# Patient Record
Sex: Male | Born: 1955
Health system: Southern US, Community
[De-identification: ages and names within clinical notes are randomized; demographics above are authoritative.]

## PROBLEM LIST (undated history)

## (undated) DIAGNOSIS — N200 Calculus of kidney: Secondary | ICD-10-CM

## (undated) DIAGNOSIS — I2510 Atherosclerotic heart disease of native coronary artery without angina pectoris: Secondary | ICD-10-CM

## (undated) DIAGNOSIS — C44229 Squamous cell carcinoma of skin of left ear and external auricular canal: Secondary | ICD-10-CM

## (undated) DIAGNOSIS — M1711 Unilateral primary osteoarthritis, right knee: Secondary | ICD-10-CM

## (undated) DIAGNOSIS — N529 Male erectile dysfunction, unspecified: Secondary | ICD-10-CM

## (undated) DIAGNOSIS — E785 Hyperlipidemia, unspecified: Secondary | ICD-10-CM

## (undated) HISTORY — DX: Atherosclerotic heart disease of native coronary artery without angina pectoris: I25.10

## (undated) HISTORY — DX: Hyperlipidemia, unspecified: E78.5

## (undated) HISTORY — DX: Calculus of kidney: N20.0

## (undated) HISTORY — DX: Male erectile dysfunction, unspecified: N52.9

## (undated) HISTORY — PX: LITHOTRIPSY: SUR834

## (undated) HISTORY — PX: NEPHROSTOMY: SHX1014

## (undated) HISTORY — PX: UMBILICAL HERNIA REPAIR: SHX196

## (undated) HISTORY — PX: INGUINAL HERNIA REPAIR: SUR1180

---
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
## 1980-04-06 HISTORY — PX: GANGLION CYST EXCISION: SHX1691

## 1994-04-06 DIAGNOSIS — N200 Calculus of kidney: Secondary | ICD-10-CM

## 1994-04-06 HISTORY — DX: Calculus of kidney: N20.0

## 2005-10-25 ENCOUNTER — Emergency Department (HOSPITAL_COMMUNITY): Admission: EM | Admit: 2005-10-25 | Discharge: 2005-10-26 | Payer: Self-pay | Admitting: Emergency Medicine

## 2005-10-29 ENCOUNTER — Encounter (HOSPITAL_COMMUNITY): Admission: RE | Admit: 2005-10-29 | Discharge: 2005-11-23 | Payer: Self-pay | Admitting: Emergency Medicine

## 2009-03-18 ENCOUNTER — Observation Stay (HOSPITAL_COMMUNITY): Admission: EM | Admit: 2009-03-18 | Discharge: 2009-03-18 | Payer: Self-pay | Admitting: Emergency Medicine

## 2009-04-06 HISTORY — PX: KNEE SURGERY: SHX244

## 2010-07-08 LAB — DIFFERENTIAL
Basophils Absolute: 0 10*3/uL (ref 0.0–0.1)
Basophils Relative: 0 % (ref 0–1)
Eosinophils Relative: 0 % (ref 0–5)
Monocytes Absolute: 0.4 10*3/uL (ref 0.1–1.0)

## 2010-07-08 LAB — CBC
HCT: 50 % (ref 39.0–52.0)
Hemoglobin: 17.2 g/dL — ABNORMAL HIGH (ref 13.0–17.0)
MCHC: 34.5 g/dL (ref 30.0–36.0)
MCV: 89.1 fL (ref 78.0–100.0)
RDW: 12.2 % (ref 11.5–15.5)

## 2010-07-08 LAB — BASIC METABOLIC PANEL
CO2: 22 mEq/L (ref 19–32)
Calcium: 10.5 mg/dL (ref 8.4–10.5)
Glucose, Bld: 173 mg/dL — ABNORMAL HIGH (ref 70–99)
Potassium: 4.3 mEq/L (ref 3.5–5.1)
Sodium: 139 mEq/L (ref 135–145)

## 2010-09-18 ENCOUNTER — Encounter (INDEPENDENT_AMBULATORY_CARE_PROVIDER_SITE_OTHER): Payer: Self-pay | Admitting: Surgery

## 2010-12-12 ENCOUNTER — Encounter (INDEPENDENT_AMBULATORY_CARE_PROVIDER_SITE_OTHER): Payer: Self-pay | Admitting: Surgery

## 2012-05-04 ENCOUNTER — Ambulatory Visit: Payer: Self-pay | Admitting: Family Medicine

## 2012-05-04 VITALS — BP 137/82 | HR 69 | Temp 98.1°F | Resp 16 | Ht 70.5 in | Wt 205.0 lb

## 2012-05-04 DIAGNOSIS — Z0289 Encounter for other administrative examinations: Secondary | ICD-10-CM

## 2012-05-04 DIAGNOSIS — Z0000 Encounter for general adult medical examination without abnormal findings: Secondary | ICD-10-CM

## 2012-05-04 NOTE — Progress Notes (Signed)
[facility] [address], [place] [address] [phone]  Date:  05/04/2012   Name:  Pedro Castaneda   DOB:  1955/04/27   MRN:  [id number]  PCP:  No primary provider on file.    Chief Complaint: Annual Exam   History of Present Illness:  Pedro Castaneda is a 57 y.o. 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  He is generally healthy, and does have a PCP- however, they do not perform DOT exams. He recently had a physical and everything looked ok.  He uses one contact for near vision and one for far vision.  He does have glasses as well but did not bring them with him today  Called Dr. Dione Booze- on 04/24/2011 he was 20/20 in each eye- with his glasses.    There is no problem list on file for this patient.   Past Medical History  Diagnosis Date  . Kidney stones 1996    kidney stones removal    Past Surgical History  Procedure Date  . Ganglion cyst excision 1982    Rt Foot  . Knee surgery 2011  . Elbow surgery 1973    History  Substance Use Topics  . Smoking status: Never Smoker   . Smokeless tobacco: Never Used  . Alcohol Use: No    No family history on file.  No Known Allergies  Medication list has been reviewed and updated.  Current Outpatient Prescriptions on File Prior to Visit  Medication Sig Dispense Refill  . glucosamine-chondroitin 500-400 MG tablet Take 1,200 tablets by mouth 1 dose over 24 hours.        . vitamin C (ASCORBIC ACID) 500 MG tablet Take 500 mg by mouth daily.          Review of Systems:  As per HPI- otherwise negative.   Physical Examination: Filed Vitals:   05/04/12 1429  BP: 137/82  Pulse: 69  Temp: 98.1 F (36.7 C)  Resp: 16   Filed Vitals:   05/04/12 1429  Height: 5' 10.5" (1.791 m)  Weight: 205 lb (92.987 kg)   Body mass index is 29.00 kg/(m^2). Ideal Body Weight: Weight in (lb) to have BMI = 25: 176.4   GEN: WDWN, NAD, Non-toxic, A & O x  3 HEENT: Atraumatic, Normocephalic. Neck supple. No masses, No LAD. Bilateral TM wnl, oropharynx normal.  PEERL,EOMI.   Ears and Nose: No external deformity. CV: RRR, No M/G/R. No JVD. No thrill. No extra heart sounds. PULM: CTA B, no wheezes, crackles, rhonchi. No retractions. No resp. distress. No accessory muscle use. ABD: S, NT, ND. No rebound. No HSM. EXTR: No c/c/e NEURO Normal gait. Normal strength, sensation and DTR all extremities PSYCH: Normally interactive. Conversant. Not depressed or anxious appearing.  Calm demeanor.  GU: no inguinal hernia   Assessment and Plan: 1. Physical exam, annual    Completed [place] DOT form today for a 2 year card- will hold onto it until he returns tomorrow am with his glasses.  Assuming he is at least 20/40 in each eye he can have his DOT card.  Discussed his situation with Frances Furbish, PA- C who will be at clinic early tomorrow morning.    Abbe Amsterdam, MD

## 2012-05-04 NOTE — Patient Instructions (Addendum)
Please come in tomorrow with your glasses- we will recheck your vision and assuming you are 20/40 we will be able to certify you to drive.  Please ask for Yolande Jolly- one of our PAs.

## 2012-05-05 ENCOUNTER — Ambulatory Visit (INDEPENDENT_AMBULATORY_CARE_PROVIDER_SITE_OTHER): Payer: Self-pay

## 2012-05-05 DIAGNOSIS — Z0289 Encounter for other administrative examinations: Secondary | ICD-10-CM

## 2012-05-10 ENCOUNTER — Encounter: Payer: Self-pay | Admitting: Family Medicine

## 2015-03-04 NOTE — Progress Notes (Signed)
Cardiology Office Note   Date:  03/05/2015   ID:  Pedro Spicehomas L Moorhouse, DOB 22-Nov-1955, MRN [id number]   Patient Care Team: Johny BlamerWilliam Harris, MD as PCP - General (Family Medicine) Lyn RecordsHenry W Smith, MD as Consulting Physician (Cardiology)    Chief Complaint  Patient presents with  . New Patient    chest pain, sob     History of Present Illness: Pedro Castaneda is a 59 y.o. male with a hx of HL treated with diet only, nephrolithiasis.  He is referred by his PCP for evaluation of exertional chest pain and shortness of breath.  He has never smoked.  He has remote FHx of CAD (several male cousins have died from MI in their 6150s).  Over the past 2 mos he has started to notice exertional chest pressure and shortness of breath and decreased exercise tolerance.  Symptoms resolve with rest.  He denies any radiating symptoms. Denies nausea but has been diaphoretic.  He denies syncope. He has been dizzy with his CP in the past. He feels like his symptoms have progressed.  He denies any cough or wheezing. there is no relation to meals.  Denies pleuritic chest pain or chest pain with supine.  He denies dysphagia or odynophagia.      Studies/Reports Reviewed Today:  None   Past Medical History  Diagnosis Date  . Kidney stones 1996    kidney stones removal  . HLD (hyperlipidemia)     diet Rx  . ED (erectile dysfunction)     Past Surgical History  Procedure Laterality Date  . Ganglion cyst excision  1982    Rt Foot  . Knee surgery  2011    R knee arthroscopic  . Elbow surgery  1973  . Lithotripsy    . Nephrostomy    . Umbilical hernia repair       Current Outpatient Prescriptions  Medication Sig Dispense Refill  . glucosamine-chondroitin 500-400 MG tablet Take 1,200 tablets by mouth 1 dose over 24 hours.      . vitamin C (ASCORBIC ACID) 500 MG tablet Take 500 mg by mouth daily.      Marland Kitchen. aspirin EC 81 MG tablet Take 1 tablet (81 mg total) by mouth daily. 90 tablet 3  . nitroGLYCERIN  (NITROSTAT) 0.4 MG SL tablet Place 1 tablet (0.4 mg total) under the tongue every 5 (five) minutes as needed for chest pain. 25 tablet 3   No current facility-administered medications for this visit.    Allergies:   Review of patient's allergies indicates no known allergies.    Social History:   Social History   Social History  . Marital Status: Married    Spouse Name: N/A  . Number of Children: N/A  . Years of Education: N/A   Occupational History  . Flooring   . Real Estate    Social History Main Topics  . Smoking status: Never Smoker   . Smokeless tobacco: Never Used  . Alcohol Use: No  . Drug Use: No  . Sexual Activity: Not Asked   Other Topics Concern  . None   Social History Narrative     Family History:   Family History  Problem Relation Age of Onset  . Breast cancer Mother   . Atrial fibrillation Father   . Heart attack Cousin       ROS:   Please see the history of present illness.   Review of Systems  Constitution: Positive for malaise/fatigue.  Cardiovascular: Positive for  chest pain and dyspnea on exertion.  Hematologic/Lymphatic: Bruises/bleeds easily.  All other systems reviewed and are negative.     PHYSICAL EXAM: VS:  BP 108/80 mmHg  Pulse 65  Ht 5' 9.5" (1.765 m)  Wt 211 lb (95.709 kg)  BMI 30.72 kg/m2    Wt Readings from Last 3 Encounters:  03/05/15 211 lb (95.709 kg)  05/04/12 205 lb (92.987 kg)     GEN: Well nourished, well developed, in no acute distress HEENT: normal Neck: no JVD, no carotid bruits, no masses Cardiac:  Normal S1/S2, RRR; no murmur ,  no rubs or gallops, no edema   Respiratory:  clear to auscultation bilaterally, no wheezing, rhonchi or rales. GI: soft, nontender, nondistended, + BS MS: no deformity or atrophy Skin: warm and dry  Neuro:  CNs II-XII intact, Strength and sensation are intact Psych: Normal affect   EKG:  EKG is ordered today.  It demonstrates:   NSR, HR 65, normal axis, QTc 399  ms   Recent Labs: No results found for requested labs within last 365 days.    Lipid Panel No results found for: CHOL, TRIG, HDL, CHOLHDL, VLDL, LDLCALC, LDLDIRECT    ASSESSMENT AND PLAN:  1. Chest Pain:  He has exertional chest pain and dyspnea that seems to be getting worse.  His symptoms seem to suggest angina.  However, he has very few CRFs and his ECG is normal.  He was also seen by Dr. Verdis Prime today.  Etiology of his symptoms are not clear.   -  Arrange Plain ETT to assess for ischemia  -  Arrange Coronary Ca score  -  Start ASA 81 mg QD.    -  NTG prn  2. Hyperlipidemia:   On diet Rx.  Managed by PCP.  If Ca score is high (even if ETT is normal) would consider aggressive lipid control (LDL < 100).       Medication Changes: Current medicines are reviewed at length with the patient today.  Concerns regarding medicines are as outlined above.  The following changes have been made:   Discontinued Medications   No medications on file   Modified Medications   No medications on file   New Prescriptions   ASPIRIN EC 81 MG TABLET    Take 1 tablet (81 mg total) by mouth daily.   NITROGLYCERIN (NITROSTAT) 0.4 MG SL TABLET    Place 1 tablet (0.4 mg total) under the tongue every 5 (five) minutes as needed for chest pain.   Labs/ tests ordered today include:   Orders Placed This Encounter  Procedures  . CT Heart Morp W/Cta Cor W/Score W/Ca W/Cm &/Or Wo/Cm  . Exercise Tolerance Test     Disposition:    FU with Dr. Verdis Prime or me in 1 month.     Signed, Brynda Rim, MHS 03/05/2015 4:32 PM    [facility] [address] [place], [place], [place]  [address] Phone: [phone]; Fax: [phone]

## 2015-03-05 ENCOUNTER — Encounter: Payer: Self-pay | Admitting: Physician Assistant

## 2015-03-05 ENCOUNTER — Other Ambulatory Visit: Payer: Self-pay | Admitting: Family Medicine

## 2015-03-05 ENCOUNTER — Other Ambulatory Visit: Payer: Self-pay | Admitting: *Deleted

## 2015-03-05 ENCOUNTER — Ambulatory Visit (INDEPENDENT_AMBULATORY_CARE_PROVIDER_SITE_OTHER): Payer: BLUE CROSS/BLUE SHIELD | Admitting: Physician Assistant

## 2015-03-05 VITALS — BP 108/80 | HR 65 | Ht 69.5 in | Wt 211.0 lb

## 2015-03-05 DIAGNOSIS — R079 Chest pain, unspecified: Secondary | ICD-10-CM | POA: Diagnosis not present

## 2015-03-05 DIAGNOSIS — R072 Precordial pain: Secondary | ICD-10-CM

## 2015-03-05 DIAGNOSIS — R109 Unspecified abdominal pain: Secondary | ICD-10-CM

## 2015-03-05 DIAGNOSIS — E785 Hyperlipidemia, unspecified: Secondary | ICD-10-CM | POA: Diagnosis not present

## 2015-03-05 MED ORDER — ASPIRIN EC 81 MG PO TBEC: 81.0000 mg | DELAYED_RELEASE_TABLET | Freq: Every day | ORAL | Status: DC

## 2015-03-05 MED ORDER — NITROGLYCERIN 0.4 MG SL SUBL: 0.4000 mg | SUBLINGUAL_TABLET | SUBLINGUAL | Status: DC | PRN

## 2015-03-05 NOTE — Addendum Note (Signed)
Addended by: Burnetta SabinWITTY, Joud Pettinato K on: 03/05/2015 04:38 PM   Modules accepted: Orders

## 2015-03-05 NOTE — Patient Instructions (Addendum)
Medication Instructions:  Your physician has recommended you make the following change in your medication:  1.  START Aspirin 81 mg tablet taking 1 daily 2.  START Nitroglycerin 0.4 mg taking as directed only as needed   Labwork: None ordered  Testing/Procedures: Your physician has requested that you have an exercise tolerance test (PLEASE SCHEDULE WITH SCOTT WEAVER, PA-C)  For further information please visit [email or web address]. Please also follow instruction sheet, as given.  Non-Cardiac CT scanning, WITH CALCIUM SCORING (CAT scanning), is a noninvasive, special x-ray that produces cross-sectional images of the body using x-rays and a computer. CT scans help physicians diagnose and treat medical conditions. For some CT exams, a contrast material is used to enhance visibility in the area of the body being studied. CT scans provide greater clarity and reveal more details than regular x-ray exams.   Follow-Up: Your physician recommends that you schedule a follow-up appointment in: 1 MONTH WITH DR. Katrinka BlazingSMITH OR SCOTT WEAVER, PA-C ON A DAY DR. Katrinka BlazingSMITH IS HERE   Any Other Special Instructions Will Be Listed Below (If Applicable).  Exercise Stress Electrocardiogram An exercise stress electrocardiogram is a test that is done to evaluate the blood supply to your heart. This test may also be called exercise stress electrocardiography. The test is done while you are walking on a treadmill. The goal of this test is to raise your heart rate. This test is done to find areas of poor blood flow to the heart by determining the extent of coronary artery disease (CAD).   CAD is defined as narrowing in one or more heart (coronary) arteries of more than 70%. If you have an abnormal test result, this may mean that you are not getting adequate blood flow to your heart during exercise. Additional testing may be needed to understand why your test was abnormal. LET [facility] PROVIDER KNOW ABOUT:   Any allergies  you have.  All medicines you are taking, including vitamins, herbs, eye drops, creams, and over-the-counter medicines.  Previous problems you or members of your family have had with the use of anesthetics.  Any blood disorders you have.  Previous surgeries you have had.  Medical conditions you have.  Possibility of pregnancy, if this applies. RISKS AND COMPLICATIONS Generally, this is a safe procedure. However, as with any procedure, complications can occur. Possible complications can include:  Pain or pressure in the following areas:  Chest.  Jaw or neck.  Between your shoulder blades.  Radiating down your left arm.  Dizziness or light-headedness.  Shortness of breath.  Increased or irregular heartbeats.  Nausea or vomiting.  Heart attack (rare). BEFORE THE PROCEDURE  Avoid all forms of caffeine 24 hours before your test or as directed by your health care provider. This includes coffee, tea (even decaffeinated tea), caffeinated sodas, chocolate, cocoa, and certain pain medicines.  Follow your health care provider's instructions regarding eating and drinking before the test.  Take your medicines as directed at regular times with water unless instructed otherwise. Exceptions may include:  If you have diabetes, ask how you are to take your insulin or pills. It is common to adjust insulin dosing the morning of the test.  If you are taking beta-blocker medicines, it is important to talk to your health care provider about these medicines well before the date of your test. Taking beta-blocker medicines may interfere with the test. In some cases, these medicines need to be changed or stopped 24 hours or more before the test.  If  you wear a nitroglycerin patch, it may need to be removed prior to the test. Ask your health care provider if the patch should be removed before the test.  If you use an inhaler for any breathing condition, bring it with you to the test.  If you  are an outpatient, bring a snack so you can eat right after the stress phase of the test.  Do not smoke for 4 hours prior to the test or as directed by your health care provider.  Do not apply lotions, powders, creams, or oils on your chest prior to the test.  Wear loose-fitting clothes and comfortable shoes for the test. This test involves walking on a treadmill. PROCEDURE  Multiple patches (electrodes) will be put on your chest. If needed, small areas of your chest may have to be shaved to get better contact with the electrodes. Once the electrodes are attached to your body, multiple wires will be attached to the electrodes and your heart rate will be monitored.  Your heart will be monitored both at rest and while exercising.  You will walk on a treadmill. The treadmill will be started at a slow pace. The treadmill speed and incline will gradually be increased to raise your heart rate. AFTER THE PROCEDURE  Your heart rate and blood pressure will be monitored after the test.  You may return to your normal schedule including diet, activities, and medicines, unless your health care provider tells you otherwise.   This information is not intended to replace advice given to you by your health care provider. Make sure you discuss any questions you have with your health care provider.   Document Released: 03/20/2000 Document Revised: 03/28/2013 Document Reviewed: 11/28/2012 Elsevier Interactive Patient Education 2016 Elsevier Inc.  Coronary Calcium Scan A coronary calcium scan is an imaging test used to look for deposits of calcium and other fatty materials (plaques) in the inner lining of the blood vessels of your heart (coronary arteries). These deposits of calcium and plaques can partly clog and narrow the coronary arteries without producing any symptoms or warning signs. This puts you at risk for a heart attack. This test can detect these deposits before symptoms develop.  LET [facility]  CARE PROVIDER KNOW ABOUT:  Any allergies you have.  All medicines you are taking, including vitamins, herbs, eye drops, creams, and over-the-counter medicines.  Previous problems you or members of your family have had with the use of anesthetics.  Any blood disorders you have.  Previous surgeries you have had.  Medical conditions you have.  Possibility of pregnancy, if this applies. RISKS AND COMPLICATIONS Generally, this is a safe procedure. However, as with any procedure, complications can occur. This test involves the use of radiation. Radiation exposure can be dangerous to a pregnant woman and her unborn baby. If you are pregnant, you should not have this procedure done.  BEFORE THE PROCEDURE There is no special preparation for the procedure. PROCEDURE  You will need to undress and put on a hospital gown. You will need to remove any jewelry around your neck or chest.  Sticky electrodes are placed on your chest and are connected to an electrocardiogram (EKG or electrocardiography) machine to recorda tracing of the electrical activity of your heart.  A CT scanner will take pictures of your heart. During this time, you will be asked to lie still and hold your breath for 2-3 seconds while a picture is being taken of your heart. AFTER THE PROCEDURE  You will be allowed to get dressed.  You can return to your normal activities after the scan is done.   This information is not intended to replace advice given to you by your health care provider. Make sure you discuss any questions you have with your health care provider.   Document Released: 09/19/2007 Document Revised: 03/28/2013 Document Reviewed: 11/28/2012 Elsevier Interactive Patient Education [employer]

## 2015-03-07 ENCOUNTER — Ambulatory Visit
Admission: RE | Admit: 2015-03-07 | Discharge: 2015-03-07 | Disposition: A | Discharge status: Home / Self Care | Payer: BLUE CROSS/BLUE SHIELD | Source: Ambulatory Visit | Attending: Family Medicine | Admitting: Family Medicine

## 2015-03-07 DIAGNOSIS — R109 Unspecified abdominal pain: Secondary | ICD-10-CM

## 2015-03-11 NOTE — Addendum Note (Signed)
Addended by: Reesa ChewJONES, Alwaleed Obeso G on: 03/11/2015 05:46 PM   Modules accepted: Orders

## 2015-04-03 ENCOUNTER — Ambulatory Visit (INDEPENDENT_AMBULATORY_CARE_PROVIDER_SITE_OTHER)
Admission: RE | Admit: 2015-04-03 | Discharge: 2015-04-03 | Disposition: A | Discharge status: Home / Self Care | Payer: Self-pay | Source: Ambulatory Visit | Attending: Physician Assistant | Admitting: Physician Assistant

## 2015-04-03 ENCOUNTER — Encounter: Payer: BLUE CROSS/BLUE SHIELD | Admitting: Physician Assistant

## 2015-04-03 ENCOUNTER — Ambulatory Visit (INDEPENDENT_AMBULATORY_CARE_PROVIDER_SITE_OTHER): Payer: BLUE CROSS/BLUE SHIELD

## 2015-04-03 DIAGNOSIS — E785 Hyperlipidemia, unspecified: Secondary | ICD-10-CM

## 2015-04-03 DIAGNOSIS — R072 Precordial pain: Secondary | ICD-10-CM | POA: Diagnosis not present

## 2015-04-03 DIAGNOSIS — R079 Chest pain, unspecified: Secondary | ICD-10-CM

## 2015-04-03 LAB — EXERCISE TOLERANCE TEST
CHL RATE OF PERCEIVED EXERTION: 17
CSEPEW: 11.7 METS
CSEPHR: 100 %
Exercise duration (min): 10 min
MPHR: 163 {beats}/min
Peak HR: 164 {beats}/min
Rest HR: 77 {beats}/min

## 2015-04-04 ENCOUNTER — Other Ambulatory Visit: Payer: Self-pay | Admitting: *Deleted

## 2015-04-04 DIAGNOSIS — R911 Solitary pulmonary nodule: Secondary | ICD-10-CM

## 2015-04-05 ENCOUNTER — Telehealth: Payer: Self-pay | Admitting: *Deleted

## 2015-04-05 DIAGNOSIS — E785 Hyperlipidemia, unspecified: Secondary | ICD-10-CM

## 2015-04-05 NOTE — Telephone Encounter (Signed)
Lmptcb so that I may advise pt per Dr. Katrinka BlazingSmith who would like FLP/LFT to be done before his next appt. I lmom appt w/Scott W. PA 1/27, so let's try to get lab work 1/23, if need to change date cb to reschedule. 04/11/15 consult w/Dr. Sherene SiresWert.

## 2015-04-09 ENCOUNTER — Encounter: Payer: Self-pay | Admitting: Interventional Cardiology

## 2015-04-09 ENCOUNTER — Telehealth: Payer: Self-pay | Admitting: *Deleted

## 2015-04-09 NOTE — Telephone Encounter (Signed)
I s/w pt today about needing FLP/LFT before appt in our office. Pt states he just had lab work w/PCP. I called PCP and was told yes pt had FLP/LFT 02/2015. I Lmom on med rec dept to fax results to Tereso NewcomerScott Weaver, PA/Dr.Smith.

## 2015-04-10 ENCOUNTER — Telehealth: Payer: Self-pay

## 2015-04-10 DIAGNOSIS — E785 Hyperlipidemia, unspecified: Secondary | ICD-10-CM

## 2015-04-10 NOTE — Telephone Encounter (Signed)
-----   Message from Lyn RecordsHenry W Smith, MD sent at 04/10/2015 11:26 AM EST ----- The lipids are relatively recently performed. The LDL cholesterol is significantly elevated at 141. History should be less than 100 and optimally around 70. Start Crestor 10 mg daily and perform a lipid panel in 4-[redacted] weeks along with liver panel. The previous order for lipid panel should be canceled.

## 2015-04-10 NOTE — Telephone Encounter (Signed)
-----   Message from Henry W Smith, MD sent at 04/10/2015 11:26 AM EST ----- The lipids are relatively recently performed. The LDL cholesterol is significantly elevated at 141. History should be less than 100 and optimally around 70. Start Crestor 10 mg daily and perform a lipid panel in 4-[redacted] weeks along with liver panel. The previous order for lipid panel should be canceled. 

## 2015-04-10 NOTE — Telephone Encounter (Signed)
Pt aware of lab results and Dr.Smith's recommendation. The lipids are relatively recently performed. The LDL cholesterol is significantly elevated at 141. History should be less than 100 and optimally around 70. Start Crestor 10 mg daily and perform a lipid panel in 4-[redacted] weeks along with liver panel. The previous order for lipid panel should be canceled. Pt sts that he has not been vigilant with his and exercise, he has been able to keep his LDL <100 with diet and exercise pt st that he would like to resume his exercise and diet regimen and repeat his fasting lab ins several weeks. He is not opposed to starting statin therapy, but would like to see if he is able to lower his LDL on his own first. Adv pt that I will the message to Dr.Smith and call back with his response.   Pt rqst info on heart healthy diet, and signing up for my chart. Verified pt mailing address. Info mailed to pt

## 2015-04-11 ENCOUNTER — Encounter: Payer: Self-pay | Admitting: Internal Medicine

## 2015-04-11 ENCOUNTER — Ambulatory Visit (INDEPENDENT_AMBULATORY_CARE_PROVIDER_SITE_OTHER): Payer: BLUE CROSS/BLUE SHIELD | Admitting: Internal Medicine

## 2015-04-11 VITALS — BP 146/90 | HR 74 | Ht 69.5 in | Wt 217.0 lb

## 2015-04-11 DIAGNOSIS — R0789 Other chest pain: Secondary | ICD-10-CM | POA: Diagnosis not present

## 2015-04-11 DIAGNOSIS — R911 Solitary pulmonary nodule: Secondary | ICD-10-CM | POA: Diagnosis not present

## 2015-04-11 NOTE — Patient Instructions (Addendum)
Please see patient coordinator before you leave today  to schedule PET scan and I will call you with results  Treatment of chest pain consists of avoiding foods that cause gas (especially beans and raw vegetables like spinach and salads)  and citrucel 1 heaping tsp twice daily with a large glass of water.  Pain should improve w/in 2 weeks and if not then consider further GI work up.    Pepcid 20 mg after bfast and after supper x 6 weeks

## 2015-04-11 NOTE — Progress Notes (Signed)
Subjective:    Patient ID: Pedro Castaneda, male    DOB: Nov 17, 1955,    MRN: [id number]  HPI   [age] yowm quit smoking as teenager developed new CP in summer of 2016 > CT cardiac > 15 mm nodule RUL with no prior cxr's avail > referred to pulmonary by Tereso NewcomerScott Weaver   04/11/2015    04/11/2015 1st [facility] office visit/ Anders Hohmann   Chief Complaint  Patient presents with  . Pulmonary Consult    Referred by Dr Tereso NewcomerScott Weaver for eval of pulmonary nodule. He states he had been having CP for approx 6 months. He still has occ CP- occ with exertion. He has some SOB when he walks up an incline and with doing minimal exertion.   cp comes and goes x 6 months shortest 15 m and longest 3-4 hours, always subst ant chest s radiation/ no n or vomiting / no change with tums  occ wakes up usual hour with cp but doesn't wake him up when asleep and no worse at all with ex or deep breathing  No obvious other patterns in day to day or daytime variabilty or assoc chronic cough or   chest tightness, subjective wheeze overt sinus or hb symptoms. No unusual exp hx or h/o childhood pna/ asthma or knowledge of premature birth.  Sleeping ok without nocturnal  or early am exacerbation  of respiratory  c/o's or need for noct saba. Also denies any obvious fluctuation of symptoms with weather or environmental changes or other aggravating or alleviating factors except as outlined above   Current Medications, Allergies, Complete Past Medical History, Past Surgical History, Family History, and Social History were [facility].             Review of Systems  Constitutional: Negative for fever, chills, activity change, appetite change and unexpected weight change.  HENT: Negative for congestion, dental problem, postnasal drip, rhinorrhea, sneezing, sore throat, trouble swallowing and voice change.   Eyes: Negative for visual disturbance.  Respiratory: Positive for shortness of breath.  Negative for cough and choking.   Cardiovascular: Positive for chest pain. Negative for leg swelling.  Gastrointestinal: Negative for nausea, vomiting and abdominal pain.  Genitourinary: Negative for difficulty urinating.       Acid heartburn  Musculoskeletal: Positive for arthralgias.  Skin: Negative for rash.  Psychiatric/Behavioral: Negative for behavioral problems and confusion.       Objective:   Physical Exam  amb pleasant wm nad  Wt Readings from Last 3 Encounters:  04/11/15 217 lb (98.431 kg)  03/05/15 211 lb (95.709 kg)  05/04/12 205 lb (92.987 kg)    Vital signs reviewed  HEENT: nl dentition, turbinates, and oropharynx. Nl external ear canals without cough reflex   NECK :  without JVD/Nodes/TM/ nl carotid upstrokes bilaterally   LUNGS: no acc muscle use,  Nl contour chest which is clear to A and P bilaterally without cough on insp or exp maneuvers   CV:  RRR  no s3 or murmur or increase in P2, no edema   ABD:  soft and nontender with nl inspiratory excursion in the supine position. No bruits or organomegaly, bowel sounds nl  MS:  Nl gait/ ext warm without deformities, calf tenderness, cyanosis or clubbing No obvious joint restrictions   SKIN: warm and dry without lesions    NEURO:  alert, approp, nl sensorium with  no motor deficits     I personally reviewed images and agree  with radiology impression as follows:  CT Chest   04/03/15 15 mm anterior right upper lobe pulmonary nodule, suspicious for neoplasm.         Assessment & Plan:

## 2015-04-12 DIAGNOSIS — R0789 Other chest pain: Secondary | ICD-10-CM | POA: Insufficient documentation

## 2015-04-12 NOTE — Assessment & Plan Note (Signed)
Classic  pain pattern suggests ibs:  Stereotypical,   very limited distribution of pain locations, daytime > noct, not exacerbated by ex or coughing, worse in sitting position, associated with generalized abd bloating, not present supine due to the dome effect of the diaphragm is  canceled in that position. Frequently these patients have had multiple negative GI workups and CT scans/ cardiac w/u's which is the case here.  Treatment consists of avoiding foods that cause gas (especially beans and raw vegetables like spinach and salads)  and citrucel 1 heaping tsp twice daily with a large glass of water.  Pain should improve w/in 2 weeks and if not then consider further GI work up.     Each maintenance medication was reviewed in detail including most importantly the difference between maintenance and as needed and under what circumstances the prns are to be used.  Please see instructions for details which were reviewed in writing and the patient given a copy.

## 2015-04-12 NOTE — Assessment & Plan Note (Signed)
CT results reviewed with pt > since we don't have any previous chest x-rays available for the last 5 years the best option here is to proceed with a PET scan and then directly to an excisional biopsy if this lesion is positive. If not, given the fact that he only has a minimal smoking history, we could follow him with serial CT scans. However, it should be noted that there is no way that this nodule explains the pattern of chest pain is experienced over the last 6 months and this needs to be sorted out as well. See separate A/P.  Discussed in detail all the  indications, usual  risks and alternatives  relative to the benefits with patient who agrees to proceed with w/u  as outlined    Total time devoted to counseling  = 35/2478m review case including images  with pt/wife/ searching for old cxr's in records here and at Dr Johnathan HausenHarris's office (pt is [facility] [employer] and wanted the old xrays found to clarify before moving forward with PET) /  discussion of options/alternatives/ giving and going over instructions (see avs)

## 2015-04-15 ENCOUNTER — Institutional Professional Consult (permissible substitution): Payer: BLUE CROSS/BLUE SHIELD | Admitting: Internal Medicine

## 2015-04-15 NOTE — Telephone Encounter (Signed)
Called to give pt Dr.Smith's response. Lmom. Dr.Smith.Agrees with this action. Pt should call the office to scheduled a 6-8wk fasting lab appt. Lab orders are in Epic. Pt should tighten up on diet and exercise.

## 2015-04-24 ENCOUNTER — Telehealth: Payer: Self-pay | Admitting: Internal Medicine

## 2015-04-24 ENCOUNTER — Other Ambulatory Visit: Payer: Self-pay | Admitting: Internal Medicine

## 2015-04-24 ENCOUNTER — Ambulatory Visit (HOSPITAL_COMMUNITY)
Admission: RE | Admit: 2015-04-24 | Discharge: 2015-04-24 | Disposition: A | Discharge status: Home / Self Care | Payer: BLUE CROSS/BLUE SHIELD | Source: Ambulatory Visit | Attending: Internal Medicine | Admitting: Internal Medicine

## 2015-04-24 DIAGNOSIS — K4090 Unilateral inguinal hernia, without obstruction or gangrene, not specified as recurrent: Secondary | ICD-10-CM | POA: Diagnosis not present

## 2015-04-24 DIAGNOSIS — R911 Solitary pulmonary nodule: Secondary | ICD-10-CM | POA: Diagnosis present

## 2015-04-24 DIAGNOSIS — Z0189 Encounter for other specified special examinations: Secondary | ICD-10-CM | POA: Diagnosis present

## 2015-04-24 DIAGNOSIS — I700 Atherosclerosis of aorta: Secondary | ICD-10-CM | POA: Diagnosis not present

## 2015-04-24 LAB — GLUCOSE, CAPILLARY: Glucose-Capillary: 103 mg/dL — ABNORMAL HIGH (ref 65–99)

## 2015-04-24 MED ORDER — FLUDEOXYGLUCOSE F - 18 (FDG) INJECTION
10.2900 | Freq: Once | INTRAVENOUS | Status: AC | PRN
  Administered 2015-04-24: 10.29 via INTRAVENOUS

## 2015-04-24 NOTE — Progress Notes (Signed)
Notes Recorded by Nyoka Cowden, MD on 04/24/2015 at 10:00 AM Call patient : Study is not at all suggestive of any kind of the tumor so we just need to do a follow-up CT scan in 3 months to prove it. I placed this in the tickle file.

## 2015-04-24 NOTE — Progress Notes (Signed)
CT order placed

## 2015-04-24 NOTE — Telephone Encounter (Signed)
Result Notes     Notes Recorded by Vito Backers, CMA on 04/24/2015 at 10:16 AM Attempted to contact patient regarding results. Left message on voicemail for patient to return call. ------  Notes Recorded by Nyoka Cowden, MD on 04/24/2015 at 10:00 AM Call patient : Study is not at all suggestive of any kind of the tumor so we just need to do a follow-up CT scan in 3 months to prove it. I placed this in the tickle file.     Left message on pt's voicemail per his request with his results. Nothing further was needed.

## 2015-04-29 ENCOUNTER — Other Ambulatory Visit: Payer: BLUE CROSS/BLUE SHIELD

## 2015-05-02 ENCOUNTER — Telehealth: Payer: Self-pay | Admitting: Physician Assistant

## 2015-05-02 NOTE — Telephone Encounter (Signed)
Pt asked if we could move appt; states not having any symptoms, ok per Bing Neighbors. PA, pt aware if cp resumes call for a sooner appt. Pt agreeable to plan of care. cmf

## 2015-05-02 NOTE — Telephone Encounter (Signed)
New message   Pt has appt on 05/03/15 and he wants to know if he has to come or not?  He dont know why it was scheduled

## 2015-05-02 NOTE — Telephone Encounter (Signed)
I s/w about his appt for 1/27 w/SW PA. Pt states he has not had bee having any problems and is doing ok. Pt advised if doing ok ; then ok per PA to move f/u appt out w/FLP/LFT done the day before appt. Pt advised is sxms resume call for sooner appt.

## 2015-05-03 ENCOUNTER — Ambulatory Visit: Payer: BLUE CROSS/BLUE SHIELD | Admitting: Physician Assistant

## 2015-05-29 ENCOUNTER — Other Ambulatory Visit (INDEPENDENT_AMBULATORY_CARE_PROVIDER_SITE_OTHER): Payer: BLUE CROSS/BLUE SHIELD | Admitting: *Deleted

## 2015-05-29 DIAGNOSIS — E785 Hyperlipidemia, unspecified: Secondary | ICD-10-CM | POA: Diagnosis not present

## 2015-05-29 LAB — LIPID PANEL
CHOL/HDL RATIO: 4.8 ratio (ref <=5.0)
CHOLESTEROL: 176 mg/dL (ref 125–200)
HDL: 37 mg/dL — AB (ref >=40)
LDL Cholesterol: 124 mg/dL (ref <130)
TRIGLYCERIDES: 74 mg/dL (ref <150)
VLDL: 15 mg/dL (ref <30)

## 2015-05-29 LAB — HEPATIC FUNCTION PANEL
ALBUMIN: 3.7 g/dL (ref 3.6–5.1)
ALK PHOS: 76 U/L (ref 40–115)
ALT: 20 U/L (ref 9–46)
AST: 18 U/L (ref 10–35)
Bilirubin, Direct: 0.1 mg/dL (ref <=0.2)
Indirect Bilirubin: 0.4 mg/dL (ref 0.2–1.2)
Total Bilirubin: 0.5 mg/dL (ref 0.2–1.2)
Total Protein: 6.8 g/dL (ref 6.1–8.1)

## 2015-05-29 NOTE — Progress Notes (Signed)
Cardiology Office Note:    Date:  05/30/2015   ID:  Pedro Castaneda, DOB 15-May-1955, MRN [id number]  PCP:  Johny Blamer, MD  Cardiologist:  Dr. Verdis Prime   Electrophysiologist:  n/a  Chief Complaint  Patient presents with  . Chest Pain    Follow up    History of Present Illness:     Pedro Castaneda is a 60 y.o. male with a hx of HL treated with diet only, nephrolithiasis. He was referred by his PCP in November 2016 for evaluation of exertional chest pain and shortness of breath. He has remote FHx of CAD (several male cousins have died from MI in their 4s). He described exertional chest pressure and shortness of breath and decreased exercise tolerance. Chest CT demonstrated a calcium score of 23 which was in the 49th percentile for age and sex matched controls. Exercise treadmill test demonstrated no ischemic ST changes. Statin therapy had been recommended. Extracardiac findings on CT scan did demonstrate 15 mm anterior right upper lobe nodule. He was referred to pulmonology. PET scan was obtained and demonstrated low metabolic activity which favored a benign etiology. Follow-up CT with contrast in 3 months was recommended.   Patient returns for routine follow-up.  He is doing well. He still has occasional chest tightness.  However, this is overall improved. He still plays basketball in an adult league.  He denies chest symptoms during this.  Denies dyspnea, orthopnea, PND, syncope.    Past Medical History  Diagnosis Date  . Kidney stones 1996    kidney stones removal  . HLD (hyperlipidemia)     diet Rx  . Pedro (erectile dysfunction)     Past Surgical History  Procedure Laterality Date  . Ganglion cyst excision  1982    Rt Foot  . Knee surgery  2011    R knee arthroscopic  . Elbow surgery  1973  . Lithotripsy    . Nephrostomy    . Umbilical hernia repair      Current Medications: Outpatient Prescriptions Prior to Visit  Medication Sig Dispense Refill  . ALFALFA PO  1 capsule daily.    Marland Kitchen aspirin EC 81 MG tablet Take 1 tablet (81 mg total) by mouth daily. (Patient taking differently: Take 81 mg by mouth every 6 (six) hours as needed for mild pain. ) 90 tablet 3  . famotidine (PEPCID) 20 MG tablet Take 20 mg by mouth daily as needed for heartburn or indigestion.    Marland Kitchen glucosamine-chondroitin 500-400 MG tablet Take 1,200 tablets by mouth 1 dose over 24 hours.      . Multiple Vitamin (MULTIVITAMIN) capsule Take 1 capsule by mouth daily.    . Red Yeast Rice Extract (RED YEAST RICE PO) Take 1 tablet by mouth daily.    . Tadalafil (CIALIS PO) Take 1 tablet by mouth daily as needed.    . vitamin C (ASCORBIC ACID) 500 MG tablet Take 500 mg by mouth daily.       No facility-administered medications prior to visit.     Allergies:   Review of patient's allergies indicates no known allergies.   Social History   Social History  . Marital Status: Married    Spouse Name: N/A  . Number of Children: N/A  . Years of Education: N/A   Occupational History  . Flooring   . Real Estate    Social History Main Topics  . Smoking status: Former Smoker -- 0.25 packs/day for 1 years  Types: Cigarettes    Quit date: 04/07/1975  . Smokeless tobacco: Never Used  . Alcohol Use: No  . Drug Use: No  . Sexual Activity: Not Asked   Other Topics Concern  . None   Social History Narrative     Family History:  The patient's family history includes Atrial fibrillation in his father; Breast cancer in his mother; Heart attack in his cousin.   ROS:   Please see the history of present illness.    ROS All other systems reviewed and are negative.   Physical Exam:    VS:  BP 122/80 mmHg  Pulse 58  Ht 5' 9.5" (1.765 m)  Wt 203 lb 6.4 oz (92.262 kg)  BMI 29.62 kg/m2  SpO2 98%   GEN: Well nourished, well developed, in no acute distress HEENT: normal Neck: no JVD, no masses Cardiac: Normal S1/S2, RRR; no murmurs, rno edema;  no carotid bruits,   Respiratory:  clear to  auscultation bilaterally; no wheezing, rhonchi or rales GI: soft, nontender  MS: no deformity or atrophy Skin: warm and dry  Neuro:  no focal deficits  Psych: Alert and oriented x 3, normal affect  Wt Readings from Last 3 Encounters:  05/30/15 203 lb 6.4 oz (92.262 kg)  04/11/15 217 lb (98.431 kg)  03/05/15 211 lb (95.709 kg)      Studies/Labs Reviewed:     EKG:  EKG is  ordered today.  The ekg ordered today demonstrates sinus brady, HR 58, QTc 386 ms, no changes  Recent Labs: 05/29/2015: ALT 20   Recent Lipid Panel    Component Value Date/Time   CHOL 176 05/29/2015 0806   TRIG 74 05/29/2015 0806   HDL 37* 05/29/2015 0806   CHOLHDL 4.8 05/29/2015 0806   VLDL 15 05/29/2015 0806   LDLCALC 124 05/29/2015 0806    Additional studies/ records that were reviewed today include:   Cardiac CT 04/03/15 IMPRESSION: Coronary calcium score of 23. This was 49th percentile for age and sex matched control.  ETT 04/03/15 There was no ST segment deviation noted during stress. Good exercise capacity.  He did note chest pain ("tightness").  Normal BP response to exercise. No ST changes to suggest ischemia.   ASSESSMENT:     1. Precordial pain   2. Coronary artery calcification seen on CT scan     PLAN:     In order of problems listed above:  1. Chest Pain: He has a history of exertional chest pain and dyspnea.  CT scan has demonstrated coronary calcification with a calcium score of 23. ETT was low risk and negative for ischemic ST changes. He has not had any progression in his symptoms.  He actually feels better and thinks that his symptoms are related to stress.  2. Coronary calcification on CT -  He should remain on Aspirin.  We had a long discussion regarding statin Rx and its benefits.  He would like to continue with diet and exercise. 10 year CVD risk is 6.9%.  Will FU in 6 mos with repeat Lipid panel.  If LDL still > 70, would recommend starting statin Rx.      Medication Adjustments/Labs and Tests Ordered: Current medicines are reviewed at length with the patient today.  Concerns regarding medicines are outlined above.  Medication changes, Labs and Tests ordered today are outlined in the Patient Instructions noted below. Patient Instructions  Medication Instructions:  Your physician recommends that you continue on your current medications as directed. Please refer  to the Current Medication list given to you today.  Labwork: LIPID 1 WEEK BEFORE YOUR APPT WHEN COME BACK IN 6 MONTHS TO SEE DR. Katrinka Blazing  Testing/Procedures: NONE  Follow-Up: Your physician wants you to follow-up in: 6 MONTHS WITH DR. Katrinka Blazing. You will receive a reminder letter in the mail two months in advance. If you don't receive a letter, please call our office to schedule the follow-up appointment.  Any Other Special Instructions Will Be Listed Below (If Applicable).  If you need a refill on your cardiac medications before your next appointment, please call your pharmacy.   Signed, Tereso Newcomer, PA-C  05/30/2015 9:37 AM    [facility] [address] [place], [place], [place]  [address] Phone: [phone]; Fax: [phone]

## 2015-05-30 ENCOUNTER — Encounter: Payer: Self-pay | Admitting: Physician Assistant

## 2015-05-30 ENCOUNTER — Ambulatory Visit (INDEPENDENT_AMBULATORY_CARE_PROVIDER_SITE_OTHER): Payer: BLUE CROSS/BLUE SHIELD | Admitting: Physician Assistant

## 2015-05-30 VITALS — BP 122/80 | HR 58 | Ht 69.5 in | Wt 203.4 lb

## 2015-05-30 DIAGNOSIS — I2510 Atherosclerotic heart disease of native coronary artery without angina pectoris: Secondary | ICD-10-CM

## 2015-05-30 DIAGNOSIS — R072 Precordial pain: Secondary | ICD-10-CM | POA: Diagnosis not present

## 2015-05-30 DIAGNOSIS — E785 Hyperlipidemia, unspecified: Secondary | ICD-10-CM | POA: Diagnosis not present

## 2015-05-30 NOTE — Patient Instructions (Addendum)
Medication Instructions:  Your physician recommends that you continue on your current medications as directed. Please refer to the Current Medication list given to you today.  Labwork: LIPID 1 WEEK BEFORE YOUR APPT WHEN COME BACK IN 6 MONTHS TO SEE DR. Katrinka Blazing  Testing/Procedures: NONE  Follow-Up: Your physician wants you to follow-up in: 6 MONTHS WITH DR. Katrinka Blazing. You will receive a reminder letter in the mail two months in advance. If you don't receive a letter, please call our office to schedule the follow-up appointment.  Any Other Special Instructions Will Be Listed Below (If Applicable).  If you need a refill on your cardiac medications before your next appointment, please call your pharmacy.

## 2015-07-09 ENCOUNTER — Other Ambulatory Visit: Payer: Self-pay | Admitting: Orthopedic Surgery

## 2015-07-09 DIAGNOSIS — M79604 Pain in right leg: Secondary | ICD-10-CM

## 2015-07-09 DIAGNOSIS — M545 Low back pain: Secondary | ICD-10-CM

## 2015-07-16 ENCOUNTER — Other Ambulatory Visit: Payer: Self-pay | Admitting: Internal Medicine

## 2015-07-16 DIAGNOSIS — R911 Solitary pulmonary nodule: Secondary | ICD-10-CM

## 2015-07-23 ENCOUNTER — Ambulatory Visit (INDEPENDENT_AMBULATORY_CARE_PROVIDER_SITE_OTHER)
Admission: RE | Admit: 2015-07-23 | Discharge: 2015-07-23 | Disposition: A | Discharge status: Home / Self Care | Payer: BLUE CROSS/BLUE SHIELD | Source: Ambulatory Visit | Attending: Internal Medicine | Admitting: Internal Medicine

## 2015-07-23 DIAGNOSIS — R911 Solitary pulmonary nodule: Secondary | ICD-10-CM | POA: Diagnosis not present

## 2015-07-23 NOTE — Progress Notes (Signed)
Quick Note:  Spoke with pt and notified of results per Dr. Wert. Pt verbalized understanding and denied any questions.  ______ 

## 2015-07-28 ENCOUNTER — Ambulatory Visit
Admission: RE | Admit: 2015-07-28 | Discharge: 2015-07-28 | Disposition: A | Discharge status: Home / Self Care | Payer: BLUE CROSS/BLUE SHIELD | Source: Ambulatory Visit | Attending: Orthopedic Surgery | Admitting: Orthopedic Surgery

## 2015-07-28 DIAGNOSIS — M79604 Pain in right leg: Secondary | ICD-10-CM

## 2015-07-28 DIAGNOSIS — M545 Low back pain: Secondary | ICD-10-CM

## 2015-08-13 DIAGNOSIS — M9904 Segmental and somatic dysfunction of sacral region: Secondary | ICD-10-CM | POA: Diagnosis not present

## 2015-08-13 DIAGNOSIS — M25561 Pain in right knee: Secondary | ICD-10-CM | POA: Diagnosis not present

## 2015-08-13 DIAGNOSIS — M545 Low back pain: Secondary | ICD-10-CM | POA: Diagnosis not present

## 2015-08-13 DIAGNOSIS — M9903 Segmental and somatic dysfunction of lumbar region: Secondary | ICD-10-CM | POA: Diagnosis not present

## 2015-10-09 DIAGNOSIS — H02051 Trichiasis without entropian right upper eyelid: Secondary | ICD-10-CM | POA: Diagnosis not present

## 2015-11-04 DIAGNOSIS — K4090 Unilateral inguinal hernia, without obstruction or gangrene, not specified as recurrent: Secondary | ICD-10-CM | POA: Diagnosis not present

## 2015-11-22 DIAGNOSIS — K4090 Unilateral inguinal hernia, without obstruction or gangrene, not specified as recurrent: Secondary | ICD-10-CM | POA: Diagnosis not present

## 2015-11-22 DIAGNOSIS — D176 Benign lipomatous neoplasm of spermatic cord: Secondary | ICD-10-CM | POA: Diagnosis not present

## 2015-12-25 ENCOUNTER — Other Ambulatory Visit: Payer: Self-pay | Admitting: Internal Medicine

## 2015-12-25 DIAGNOSIS — R911 Solitary pulmonary nodule: Secondary | ICD-10-CM

## 2016-01-07 DIAGNOSIS — J028 Acute pharyngitis due to other specified organisms: Secondary | ICD-10-CM | POA: Diagnosis not present

## 2016-01-07 DIAGNOSIS — J0100 Acute maxillary sinusitis, unspecified: Secondary | ICD-10-CM | POA: Diagnosis not present

## 2016-01-23 ENCOUNTER — Ambulatory Visit (INDEPENDENT_AMBULATORY_CARE_PROVIDER_SITE_OTHER)
Admission: RE | Admit: 2016-01-23 | Discharge: 2016-01-23 | Disposition: A | Discharge status: Home / Self Care | Payer: BLUE CROSS/BLUE SHIELD | Source: Ambulatory Visit | Attending: Internal Medicine | Admitting: Internal Medicine

## 2016-01-23 DIAGNOSIS — R911 Solitary pulmonary nodule: Secondary | ICD-10-CM

## 2016-01-24 ENCOUNTER — Telehealth: Payer: Self-pay | Admitting: Internal Medicine

## 2016-01-24 NOTE — Progress Notes (Signed)
lmtcb

## 2016-01-24 NOTE — Telephone Encounter (Signed)
Per 10.19.17 result note: Result Notes  Notes Recorded by Christen ButterLeslie M Raskin, CMA on 01/24/2016 at 10:55 AM EDT lmtcb ------  Notes Recorded by Nyoka CowdenMichael B Wert, MD on 01/23/2016 at 12:38 PM EDT Call pt: Reviewed ct and not change so rec f/u 04/02/17 per radiology guidelines - in reminder file    LMOM TCB x1

## 2016-01-24 NOTE — Progress Notes (Signed)
See phone note dated 01/24/16

## 2016-01-24 NOTE — Telephone Encounter (Signed)
Spoke with pt and notified of results per Dr. Wert. Pt verbalized understanding and denied any questions. 

## 2016-02-24 DIAGNOSIS — R0683 Snoring: Secondary | ICD-10-CM | POA: Diagnosis not present

## 2016-03-24 ENCOUNTER — Emergency Department (HOSPITAL_COMMUNITY): Payer: BLUE CROSS/BLUE SHIELD | Admitting: Anesthesiology

## 2016-03-24 ENCOUNTER — Encounter (HOSPITAL_COMMUNITY): Payer: Self-pay

## 2016-03-24 ENCOUNTER — Other Ambulatory Visit: Payer: Self-pay

## 2016-03-24 ENCOUNTER — Inpatient Hospital Stay (HOSPITAL_COMMUNITY)
Admission: EM | Admit: 2016-03-24 | Discharge: 2016-03-26 | DRG: 343 | Disposition: A | Discharge status: Home / Self Care | Payer: BLUE CROSS/BLUE SHIELD | Attending: Emergency Medicine | Admitting: Emergency Medicine

## 2016-03-24 ENCOUNTER — Encounter (HOSPITAL_COMMUNITY): Admission: EM | Disposition: A | Discharge status: Home / Self Care | Payer: Self-pay | Source: Home / Self Care

## 2016-03-24 ENCOUNTER — Emergency Department (HOSPITAL_COMMUNITY): Payer: BLUE CROSS/BLUE SHIELD

## 2016-03-24 DIAGNOSIS — K219 Gastro-esophageal reflux disease without esophagitis: Secondary | ICD-10-CM | POA: Diagnosis not present

## 2016-03-24 DIAGNOSIS — K3580 Unspecified acute appendicitis: Principal | ICD-10-CM | POA: Diagnosis present

## 2016-03-24 DIAGNOSIS — N400 Enlarged prostate without lower urinary tract symptoms: Secondary | ICD-10-CM | POA: Diagnosis not present

## 2016-03-24 DIAGNOSIS — R911 Solitary pulmonary nodule: Secondary | ICD-10-CM | POA: Diagnosis not present

## 2016-03-24 DIAGNOSIS — E785 Hyperlipidemia, unspecified: Secondary | ICD-10-CM | POA: Diagnosis not present

## 2016-03-24 DIAGNOSIS — Z87891 Personal history of nicotine dependence: Secondary | ICD-10-CM | POA: Diagnosis not present

## 2016-03-24 DIAGNOSIS — R0789 Other chest pain: Secondary | ICD-10-CM | POA: Diagnosis not present

## 2016-03-24 DIAGNOSIS — R109 Unspecified abdominal pain: Secondary | ICD-10-CM | POA: Diagnosis not present

## 2016-03-24 HISTORY — PX: LAPAROSCOPIC APPENDECTOMY: SHX408

## 2016-03-24 LAB — COMPREHENSIVE METABOLIC PANEL
ALBUMIN: 4 g/dL (ref 3.5–5.0)
ALK PHOS: 102 U/L (ref 38–126)
ALT: 27 U/L (ref 17–63)
ANION GAP: 7 (ref 5–15)
AST: 24 U/L (ref 15–41)
BUN: 11 mg/dL (ref 6–20)
CALCIUM: 9.2 mg/dL (ref 8.9–10.3)
CO2: 26 mmol/L (ref 22–32)
CREATININE: 0.99 mg/dL (ref 0.61–1.24)
Chloride: 105 mmol/L (ref 101–111)
GFR calc Af Amer: >60 mL/min (ref >60)
GFR calc non Af Amer: >60 mL/min (ref >60)
GLUCOSE: 119 mg/dL — AB (ref 65–99)
Potassium: 3.9 mmol/L (ref 3.5–5.1)
SODIUM: 138 mmol/L (ref 135–145)
Total Bilirubin: 1.2 mg/dL (ref 0.3–1.2)
Total Protein: 7.2 g/dL (ref 6.5–8.1)

## 2016-03-24 LAB — URINALYSIS, ROUTINE W REFLEX MICROSCOPIC
BILIRUBIN URINE: NEGATIVE
Glucose, UA: NEGATIVE mg/dL
HGB URINE DIPSTICK: NEGATIVE
Ketones, ur: 5 mg/dL — AB
Leukocytes, UA: NEGATIVE
Nitrite: NEGATIVE
PH: 6 (ref 5.0–8.0)
Protein, ur: NEGATIVE mg/dL
SPECIFIC GRAVITY, URINE: 1.018 (ref 1.005–1.030)

## 2016-03-24 LAB — CBC WITH DIFFERENTIAL/PLATELET
Basophils Absolute: 0 10*3/uL (ref 0.0–0.1)
Basophils Relative: 0 %
EOS ABS: 0 10*3/uL (ref 0.0–0.7)
Eosinophils Relative: 0 %
HCT: 44.6 % (ref 39.0–52.0)
HEMOGLOBIN: 15.6 g/dL (ref 13.0–17.0)
LYMPHS ABS: 1 10*3/uL (ref 0.7–4.0)
Lymphocytes Relative: 8 %
MCH: 30.4 pg (ref 26.0–34.0)
MCHC: 35 g/dL (ref 30.0–36.0)
MCV: 86.9 fL (ref 78.0–100.0)
MONOS PCT: 7 %
Monocytes Absolute: 0.9 10*3/uL (ref 0.1–1.0)
NEUTROS PCT: 85 %
Neutro Abs: 11.1 10*3/uL — ABNORMAL HIGH (ref 1.7–7.7)
Platelets: 248 10*3/uL (ref 150–400)
RBC: 5.13 MIL/uL (ref 4.22–5.81)
RDW: 12.7 % (ref 11.5–15.5)
WBC: 13 10*3/uL — ABNORMAL HIGH (ref 4.0–10.5)

## 2016-03-24 LAB — LIPASE, BLOOD: Lipase: 22 U/L (ref 11–51)

## 2016-03-24 SURGERY — APPENDECTOMY, LAPAROSCOPIC
Anesthesia: General

## 2016-03-24 MED ORDER — MORPHINE SULFATE (PF) 2 MG/ML IV SOLN
1.0000 mg | INTRAVENOUS | Status: DC | PRN
  Administered 2016-03-24: 1 mg via INTRAVENOUS
  Filled 2016-03-24: qty 1

## 2016-03-24 MED ORDER — ENOXAPARIN SODIUM 40 MG/0.4ML ~~LOC~~ SOLN: 40.0000 mg | SUBCUTANEOUS | Status: DC

## 2016-03-24 MED ORDER — FENTANYL CITRATE (PF) 100 MCG/2ML IJ SOLN
INTRAMUSCULAR | Status: DC | PRN
  Administered 2016-03-24 (×2): 50 ug via INTRAVENOUS

## 2016-03-24 MED ORDER — IOPAMIDOL (ISOVUE-300) INJECTION 61%
75.0000 mL | Freq: Once | INTRAVENOUS | Status: AC | PRN
  Administered 2016-03-24: 75 mL via INTRAVENOUS

## 2016-03-24 MED ORDER — LACTATED RINGERS IR SOLN
Status: DC | PRN
  Administered 2016-03-24: 3000 mL

## 2016-03-24 MED ORDER — ONDANSETRON HCL 4 MG/2ML IJ SOLN: 4.0000 mg | Freq: Four times a day (QID) | INTRAMUSCULAR | Status: DC | PRN

## 2016-03-24 MED ORDER — LIDOCAINE 2% (20 MG/ML) 5 ML SYRINGE
INTRAMUSCULAR | Status: AC
  Filled 2016-03-24: qty 5

## 2016-03-24 MED ORDER — HYDRALAZINE HCL 20 MG/ML IJ SOLN: 10.0000 mg | INTRAMUSCULAR | Status: DC | PRN

## 2016-03-24 MED ORDER — PIPERACILLIN-TAZOBACTAM 3.375 G IVPB: 3.3750 g | Freq: Three times a day (TID) | INTRAVENOUS | Status: DC

## 2016-03-24 MED ORDER — BUPIVACAINE LIPOSOME 1.3 % IJ SUSP
20.0000 mL | Freq: Once | INTRAMUSCULAR | Status: AC
  Administered 2016-03-24: 20 mL
  Filled 2016-03-24: qty 20

## 2016-03-24 MED ORDER — PROPOFOL 10 MG/ML IV BOLUS
INTRAVENOUS | Status: AC
  Filled 2016-03-24: qty 20

## 2016-03-24 MED ORDER — 0.9 % SODIUM CHLORIDE (POUR BTL) OPTIME
TOPICAL | Status: DC | PRN
  Administered 2016-03-24: 1000 mL

## 2016-03-24 MED ORDER — SUGAMMADEX SODIUM 200 MG/2ML IV SOLN
INTRAVENOUS | Status: DC | PRN
  Administered 2016-03-24: 200 mg via INTRAVENOUS

## 2016-03-24 MED ORDER — ONDANSETRON 4 MG PO TBDP: 4.0000 mg | ORAL_TABLET | Freq: Four times a day (QID) | ORAL | Status: DC | PRN

## 2016-03-24 MED ORDER — HYDROMORPHONE HCL 1 MG/ML IJ SOLN
0.2500 mg | INTRAMUSCULAR | Status: DC | PRN
  Administered 2016-03-24: 0.5 mg via INTRAVENOUS

## 2016-03-24 MED ORDER — SUGAMMADEX SODIUM 200 MG/2ML IV SOLN
INTRAVENOUS | Status: AC
  Filled 2016-03-24: qty 2

## 2016-03-24 MED ORDER — HYDROMORPHONE HCL 1 MG/ML IJ SOLN: 0.2500 mg | INTRAMUSCULAR | Status: DC | PRN

## 2016-03-24 MED ORDER — SUCCINYLCHOLINE CHLORIDE 200 MG/10ML IV SOSY: PREFILLED_SYRINGE | INTRAVENOUS | Status: DC | PRN

## 2016-03-24 MED ORDER — LACTATED RINGERS IV SOLN
INTRAVENOUS | Status: DC | PRN
  Administered 2016-03-24: 18:00:00 via INTRAVENOUS

## 2016-03-24 MED ORDER — SUCCINYLCHOLINE CHLORIDE 200 MG/10ML IV SOSY
PREFILLED_SYRINGE | INTRAVENOUS | Status: AC
  Filled 2016-03-24: qty 10

## 2016-03-24 MED ORDER — KCL IN DEXTROSE-NACL 20-5-0.45 MEQ/L-%-% IV SOLN
INTRAVENOUS | Status: DC
  Administered 2016-03-24: 22:00:00 via INTRAVENOUS
  Filled 2016-03-24 (×2): qty 1000

## 2016-03-24 MED ORDER — IOPAMIDOL (ISOVUE-300) INJECTION 61%: 100.0000 mL | Freq: Once | INTRAVENOUS | Status: DC | PRN

## 2016-03-24 MED ORDER — LIDOCAINE 2% (20 MG/ML) 5 ML SYRINGE
INTRAMUSCULAR | Status: DC | PRN
  Administered 2016-03-24 (×2): 100 mg via INTRAVENOUS

## 2016-03-24 MED ORDER — ONDANSETRON HCL 4 MG/2ML IJ SOLN
INTRAMUSCULAR | Status: AC
  Filled 2016-03-24: qty 2

## 2016-03-24 MED ORDER — HEPARIN SODIUM (PORCINE) 5000 UNIT/ML IJ SOLN
5000.0000 [IU] | Freq: Three times a day (TID) | INTRAMUSCULAR | Status: DC
  Administered 2016-03-24 – 2016-03-26 (×5): 5000 [IU] via SUBCUTANEOUS
  Filled 2016-03-24 (×5): qty 1

## 2016-03-24 MED ORDER — MORPHINE SULFATE (PF) 2 MG/ML IV SOLN
2.0000 mg | INTRAVENOUS | Status: DC | PRN
  Administered 2016-03-24: 4 mg via INTRAVENOUS
  Filled 2016-03-24: qty 2

## 2016-03-24 MED ORDER — PANTOPRAZOLE SODIUM 40 MG IV SOLR: 40.0000 mg | Freq: Every day | INTRAVENOUS | Status: DC

## 2016-03-24 MED ORDER — IOPAMIDOL (ISOVUE-300) INJECTION 61%
INTRAVENOUS | Status: AC
  Filled 2016-03-24: qty 75

## 2016-03-24 MED ORDER — DIPHENHYDRAMINE HCL 25 MG PO CAPS: 25.0000 mg | ORAL_CAPSULE | Freq: Four times a day (QID) | ORAL | Status: DC | PRN

## 2016-03-24 MED ORDER — SUCCINYLCHOLINE CHLORIDE 200 MG/10ML IV SOSY
PREFILLED_SYRINGE | INTRAVENOUS | Status: DC | PRN
  Administered 2016-03-24: 120 mg via INTRAVENOUS

## 2016-03-24 MED ORDER — ROCURONIUM BROMIDE 50 MG/5ML IV SOSY
PREFILLED_SYRINGE | INTRAVENOUS | Status: AC
  Filled 2016-03-24: qty 5

## 2016-03-24 MED ORDER — PROPOFOL 10 MG/ML IV BOLUS
INTRAVENOUS | Status: DC | PRN
  Administered 2016-03-24: 200 mg via INTRAVENOUS

## 2016-03-24 MED ORDER — HYDROMORPHONE HCL 1 MG/ML IJ SOLN
INTRAMUSCULAR | Status: AC
  Filled 2016-03-24: qty 1

## 2016-03-24 MED ORDER — DEXAMETHASONE SODIUM PHOSPHATE 10 MG/ML IJ SOLN
INTRAMUSCULAR | Status: DC | PRN
  Administered 2016-03-24: 10 mg via INTRAVENOUS

## 2016-03-24 MED ORDER — MORPHINE SULFATE (PF) 4 MG/ML IV SOLN
4.0000 mg | Freq: Once | INTRAVENOUS | Status: AC
Start: 2016-03-24 — End: 2016-03-24
  Administered 2016-03-24: 4 mg via INTRAVENOUS
  Filled 2016-03-24: qty 1

## 2016-03-24 MED ORDER — ROCURONIUM BROMIDE 50 MG/5ML IV SOSY
PREFILLED_SYRINGE | INTRAVENOUS | Status: DC | PRN
  Administered 2016-03-24: 30 mg via INTRAVENOUS
  Administered 2016-03-24: 10 mg via INTRAVENOUS

## 2016-03-24 MED ORDER — PIPERACILLIN-TAZOBACTAM 3.375 G IVPB
3.3750 g | Freq: Once | INTRAVENOUS | Status: AC
  Administered 2016-03-24: 3.375 g via INTRAVENOUS
  Filled 2016-03-24: qty 50

## 2016-03-24 MED ORDER — SODIUM CHLORIDE 0.45 % IV SOLN: INTRAVENOUS | Status: DC

## 2016-03-24 MED ORDER — FENTANYL CITRATE (PF) 100 MCG/2ML IJ SOLN
INTRAMUSCULAR | Status: AC
  Filled 2016-03-24: qty 2

## 2016-03-24 MED ORDER — ONDANSETRON HCL 4 MG/2ML IJ SOLN
INTRAMUSCULAR | Status: DC | PRN
  Administered 2016-03-24: 4 mg via INTRAVENOUS

## 2016-03-24 MED ORDER — SODIUM CHLORIDE 0.9 % IJ SOLN
INTRAMUSCULAR | Status: AC
  Filled 2016-03-24: qty 10

## 2016-03-24 MED ORDER — PIPERACILLIN-TAZOBACTAM 3.375 G IVPB
3.3750 g | Freq: Three times a day (TID) | INTRAVENOUS | Status: DC
  Administered 2016-03-24 – 2016-03-26 (×5): 3.375 g via INTRAVENOUS
  Filled 2016-03-24 (×6): qty 50

## 2016-03-24 MED ORDER — DIPHENHYDRAMINE HCL 50 MG/ML IJ SOLN: 25.0000 mg | Freq: Four times a day (QID) | INTRAMUSCULAR | Status: DC | PRN

## 2016-03-24 MED ORDER — DEXAMETHASONE SODIUM PHOSPHATE 10 MG/ML IJ SOLN
INTRAMUSCULAR | Status: AC
  Filled 2016-03-24: qty 1

## 2016-03-24 MED ORDER — SODIUM CHLORIDE 0.9 % IV BOLUS (SEPSIS)
500.0000 mL | Freq: Once | INTRAVENOUS | Status: AC
  Administered 2016-03-24: 500 mL via INTRAVENOUS

## 2016-03-24 SURGICAL SUPPLY — 45 items
ADH SKN CLS APL DERMABOND .7 (GAUZE/BANDAGES/DRESSINGS) ×1
APPLIER CLIP ROT 10 11.4 M/L (STAPLE)
APR CLP MED LRG 11.4X10 (STAPLE)
BAG SPEC RTRVL 10 TROC 200 (ENDOMECHANICALS)
BAG SPEC RTRVL LRG 6X4 10 (ENDOMECHANICALS) ×1
CABLE HIGH FREQUENCY MONO STRZ (ELECTRODE) ×1 IMPLANT
CHLORAPREP W/TINT 26ML (MISCELLANEOUS) ×2 IMPLANT
CLIP APPLIE ROT 10 11.4 M/L (STAPLE) IMPLANT
COVER SURGICAL LIGHT HANDLE (MISCELLANEOUS) ×1 IMPLANT
CUTTER FLEX LINEAR 45M (STAPLE) ×1 IMPLANT
DECANTER SPIKE VIAL GLASS SM (MISCELLANEOUS) ×2 IMPLANT
DERMABOND ADVANCED (GAUZE/BANDAGES/DRESSINGS) ×1
DERMABOND ADVANCED .7 DNX12 (GAUZE/BANDAGES/DRESSINGS) IMPLANT
DRAPE LAPAROSCOPIC ABDOMINAL (DRAPES) ×2 IMPLANT
ELECT REM PT RETURN 9FT ADLT (ELECTROSURGICAL) ×2
ELECTRODE REM PT RTRN 9FT ADLT (ELECTROSURGICAL) ×1 IMPLANT
ENDOLOOP SUT PDS II  0 18 (SUTURE)
ENDOLOOP SUT PDS II 0 18 (SUTURE) IMPLANT
GLOVE BIOGEL M 8.0 STRL (GLOVE) ×2 IMPLANT
GOWN STRL REUS W/TWL XL LVL3 (GOWN DISPOSABLE) ×4 IMPLANT
IRRIG SUCT STRYKERFLOW 2 WTIP (MISCELLANEOUS) ×2
IRRIGATION SUCT STRKRFLW 2 WTP (MISCELLANEOUS) ×1 IMPLANT
KIT BASIN OR (CUSTOM PROCEDURE TRAY) ×2 IMPLANT
POUCH RETRIEVAL ECOSAC 10 (ENDOMECHANICALS) IMPLANT
POUCH RETRIEVAL ECOSAC 10MM (ENDOMECHANICALS)
POUCH SPECIMEN RETRIEVAL 10MM (ENDOMECHANICALS) ×1 IMPLANT
RELOAD 45 VASCULAR/THIN (ENDOMECHANICALS) ×2 IMPLANT
RELOAD STAPLE 45 2.5 WHT GRN (ENDOMECHANICALS) IMPLANT
RELOAD STAPLE 45 3.5 BLU ETS (ENDOMECHANICALS) IMPLANT
RELOAD STAPLE TA45 3.5 REG BLU (ENDOMECHANICALS) IMPLANT
SCISSORS LAP 5X45 EPIX DISP (ENDOMECHANICALS) IMPLANT
SET IRRIG TUBING LAPAROSCOPIC (IRRIGATION / IRRIGATOR) ×1 IMPLANT
SHEARS HARMONIC ACE PLUS 45CM (MISCELLANEOUS) ×2 IMPLANT
SLEEVE XCEL OPT CAN 5 100 (ENDOMECHANICALS) ×2 IMPLANT
STAPLER VISISTAT 35W (STAPLE) ×1 IMPLANT
SUT MNCRL AB 4-0 PS2 18 (SUTURE) ×1 IMPLANT
SUT VIC AB 4-0 SH 18 (SUTURE) ×2 IMPLANT
TOWEL OR 17X26 10 PK STRL BLUE (TOWEL DISPOSABLE) ×2 IMPLANT
TRAY FOLEY W/METER SILVER 14FR (SET/KITS/TRAYS/PACK) ×1 IMPLANT
TRAY LAPAROSCOPIC (CUSTOM PROCEDURE TRAY) ×2 IMPLANT
TROCAR BLADELESS OPT 5 100 (ENDOMECHANICALS) ×2 IMPLANT
TROCAR XCEL 12X100 BLDLESS (ENDOMECHANICALS) ×1 IMPLANT
TROCAR XCEL BLUNT TIP 100MML (ENDOMECHANICALS) ×2 IMPLANT
TROCAR XCEL NON-BLD 11X100MML (ENDOMECHANICALS) IMPLANT
TUBING INSUF HEATED (TUBING) ×2 IMPLANT

## 2016-03-24 NOTE — Interval H&P Note (Signed)
History and Physical Interval Note:  03/24/2016 5:46 PM  Pedro Castaneda  has presented today for surgery, with the diagnosis of acute appendicitis  The various methods of treatment have been discussed with the patient and family. After consideration of risks, benefits and other options for treatment, the patient has consented to  Procedure(s): APPENDECTOMY LAPAROSCOPIC (N/A) as a surgical intervention .  The patient's history has been reviewed, patient examined, no change in status, stable for surgery.  I have reviewed the patient's chart and labs.  Questions were answered to the patient's satisfaction.     Bena Kobel B

## 2016-03-24 NOTE — ED Notes (Addendum)
Pt given cloraprep wipes and instructed to remove clothing. Wife at bedside to help.

## 2016-03-24 NOTE — Anesthesia Preprocedure Evaluation (Addendum)
Anesthesia Evaluation  Patient identified by MRN, date of birth, ID band Patient awake    Reviewed: Allergy & Precautions, NPO status , Patient's Chart, lab work & pertinent test results  Airway Mallampati: II  TM Distance: >3 FB     Dental   Pulmonary former smoker,    breath sounds clear to auscultation       Cardiovascular negative cardio ROS   Rhythm:Regular Rate:Normal  History noted. CG   Neuro/Psych    GI/Hepatic Neg liver ROS, History noted CG   Endo/Other  negative endocrine ROS  Renal/GU Renal disease     Musculoskeletal   Abdominal   Peds  Hematology   Anesthesia Other Findings   Reproductive/Obstetrics                            Anesthesia Physical Anesthesia Plan  ASA: II and emergent  Anesthesia Plan: General   Post-op Pain Management:    Induction: Intravenous, Rapid sequence and Cricoid pressure planned  Airway Management Planned: Oral ETT  Additional Equipment:   Intra-op Plan:   Post-operative Plan: Extubation in OR  Informed Consent: I have reviewed the patients History and Physical, chart, labs and discussed the procedure including the risks, benefits and alternatives for the proposed anesthesia with the patient or authorized representative who has indicated his/her understanding and acceptance.   Dental advisory given  Plan Discussed with: CRNA and Anesthesiologist  Anesthesia Plan Comments:         Anesthesia Quick Evaluation

## 2016-03-24 NOTE — H&P (Signed)
[facility] Admission Note  Pedro Castaneda 19-Feb-1956  [id number].    Requesting MD: Long Chief Complaint/Reason for Consult: Acute appendicitis  HPI:  Pedro Castaneda is a 60yo male who presented to [facility] earlier today with acute onset of abdominal pain yesterday afternoon. States that his pain began in the epigastric region and gradually got worse. Concerned that it was cardiac in nature he took 629m ASA. His pain progressively became more diffuse. Denies nausea, vomiting, fevers, chills, constipation, diarrhea, or dysuria. He tried taking pepto bismal but this also did not help. States that he did not sleep very well therefore saw his PCP today who sent him to the ED due to severity of pain. Last meal was yesterday  ED workup thus far: - CT significant for enlargement and thickening of the appendix wall and stranding of surrounding fat, appendix measures at least 1.4 cm in diameter, no definite perforation - WBC 13, afebrile  PMH significant for HLD, GERD Abdominal surgical history includes umbilical hernia repair ~~5035by Dr. BRush Farmer and right inguinal hernia repair 2017 by Dr. MHassell DoneDenies consistent home use of anticoagulants, but he did take 6513mASA last night Nonsmoker Employment: owns flooring company  ROS: Review of Systems  Constitutional: Negative.   Respiratory: Negative.   Cardiovascular: Negative.   Gastrointestinal: Positive for abdominal pain. Negative for blood in stool, constipation, diarrhea, melena, nausea and vomiting.  Genitourinary: Negative.   Skin: Negative.    All systems reviewed and otherwise negative except for as above  Family History  Problem Relation Age of Onset  . Breast cancer Mother   . Atrial fibrillation Father   . Heart attack Cousin     Past Medical History:  Diagnosis Date  . ED (erectile dysfunction)   . HLD (hyperlipidemia)    diet Rx  . Kidney stones 1996   kidney stones removal    Past Surgical History:   Procedure Laterality Date  . [place]. GANGLION CYST EXCISION  1982   Rt Foot  . INGUINAL HERNIA REPAIR    . KNEE SURGERY  2011   R knee arthroscopic  . LITHOTRIPSY    . NEPHROSTOMY    . UMBILICAL HERNIA REPAIR      Social History:  reports that he quit smoking about 40 years ago. His smoking use included Cigarettes. He has a 0.25 pack-year smoking history. He has never used smokeless tobacco. He reports that he does not drink alcohol or use drugs.  Allergies: No Known Allergies   (Not in a hospital admission)  Blood pressure 182/87, pulse 60, temperature 97.8 F (36.6 C), temperature source Oral, resp. rate 16, height 5' 9.5" (1.765 m), weight 210 lb (95.3 kg), SpO2 100 %. Physical Exam: General: pleasant, WD/WN white male who is laying in bed in NAD HEENT: head is normocephalic, atraumatic.  Sclera are noninjected.  Mouth is dry Heart: regular, rate, and rhythm.  No obvious murmurs, gallops, or rubs noted.  Palpable pedal pulses bilaterally Lungs: CTAB, no wheezes, rhonchi, or rales noted.  Respiratory effort nonlabored Abd: well healed umbilical incision, soft, ND, +BS, no masses, hernias, or organomegaly. Diffuse TTP most severe RLQ MS: all 4 extremities are symmetrical with no cyanosis, clubbing, or edema. Skin: warm and dry with no masses, lesions, or rashes Psych: A&Ox3 with an appropriate affect. Neuro: CM 2-12 intact, extremity CSM intact bilaterally, normal speech  Results for orders placed or performed during the hospital encounter of 03/24/16 (from the past  48 hour(s))  Urinalysis, Routine w reflex microscopic     Status: Abnormal   Collection Time: 03/24/16 12:30 PM  Result Value Ref Range   Color, Urine YELLOW YELLOW   APPearance CLEAR CLEAR   Specific Gravity, Urine 1.018 1.005 - 1.030   pH 6.0 5.0 - 8.0   Glucose, UA NEGATIVE NEGATIVE mg/dL   Hgb urine dipstick NEGATIVE NEGATIVE   Bilirubin Urine NEGATIVE NEGATIVE   Ketones, ur 5 (A) NEGATIVE  mg/dL   Protein, ur NEGATIVE NEGATIVE mg/dL   Nitrite NEGATIVE NEGATIVE   Leukocytes, UA NEGATIVE NEGATIVE  Comprehensive metabolic panel     Status: Abnormal   Collection Time: 03/24/16 12:42 PM  Result Value Ref Range   Sodium 138 135 - 145 mmol/L   Potassium 3.9 3.5 - 5.1 mmol/L   Chloride 105 101 - 111 mmol/L   CO2 26 22 - 32 mmol/L   Glucose, Bld 119 (H) 65 - 99 mg/dL   BUN 11 6 - 20 mg/dL   Creatinine, Ser 0.99 0.61 - 1.24 mg/dL   Calcium 9.2 8.9 - 10.3 mg/dL   Total Protein 7.2 6.5 - 8.1 g/dL   Albumin 4.0 3.5 - 5.0 g/dL   AST 24 15 - 41 U/L   ALT 27 17 - 63 U/L   Alkaline Phosphatase 102 38 - 126 U/L   Total Bilirubin 1.2 0.3 - 1.2 mg/dL   GFR calc non Af Amer >60 >60 mL/min   GFR calc Af Amer >60 >60 mL/min    Comment: (NOTE) The eGFR has been calculated using the CKD EPI equation. This calculation has not been validated in all clinical situations. eGFR's persistently <60 mL/min signify possible Chronic Kidney Disease.    Anion gap 7 5 - 15  Lipase, blood     Status: None   Collection Time: 03/24/16 12:42 PM  Result Value Ref Range   Lipase 22 11 - 51 U/L  CBC with Differential     Status: Abnormal   Collection Time: 03/24/16 12:42 PM  Result Value Ref Range   WBC 13.0 (H) 4.0 - 10.5 K/uL   RBC 5.13 4.22 - 5.81 MIL/uL   Hemoglobin 15.6 13.0 - 17.0 g/dL   HCT 44.6 39.0 - 52.0 %   MCV 86.9 78.0 - 100.0 fL   MCH 30.4 26.0 - 34.0 pg   MCHC 35.0 30.0 - 36.0 g/dL   RDW 12.7 11.5 - 15.5 %   Platelets 248 150 - 400 K/uL   Neutrophils Relative % 85 %   Neutro Abs 11.1 (H) 1.7 - 7.7 K/uL   Lymphocytes Relative 8 %   Lymphs Abs 1.0 0.7 - 4.0 K/uL   Monocytes Relative 7 %   Monocytes Absolute 0.9 0.1 - 1.0 K/uL   Eosinophils Relative 0 %   Eosinophils Absolute 0.0 0.0 - 0.7 K/uL   Basophils Relative 0 %   Basophils Absolute 0.0 0.0 - 0.1 K/uL   Ct Abdomen Pelvis W Contrast  Result Date: 03/24/2016 CLINICAL DATA:  Epigastric pain starting yesterday EXAM: CT  ABDOMEN AND PELVIS WITH CONTRAST TECHNIQUE: Multidetector CT imaging of the abdomen and pelvis was performed using the standard protocol following bolus administration of intravenous contrast. CONTRAST:  31m ISOVUE-300 IOPAMIDOL (ISOVUE-300) INJECTION 61% COMPARISON:  04/24/2015 FINDINGS: Lower chest: Lung bases are normal. Hepatobiliary: Enhanced liver shows indeterminate low-density lesion in right hepatic lobe centrally measures 1.7 cm. Indeterminate lesion in right hepatic lobe just above the gallbladder measures 1.2 cm. Further correlation with nonemergent  enhanced MRI is recommended. Pancreas: Enhanced pancreas is normal. Spleen: Enhanced spleen is normal. Adrenals/Urinary Tract: No adrenal gland mass. Enhanced kidneys are symmetrical in size. There is a left renal cyst measures 9 mm. Delayed renal images shows bilateral renal symmetrical excretion. No hydronephrosis or hydroureter. Bilateral visualized proximal ureter is unremarkable. The urinary bladder shows triangular deformity in right anterior aspect due to pulling effect of a right inguinal scrotal hernia containing fat. No evidence of acute complication. Stomach/Bowel: No small bowel obstruction. There is significant enlargement and thickening of the appendix wall and stranding of surrounding fat. The appendix measures at least 1.4 cm in diameter. Findings are consistent with acute appendicitis. No definite evidence of perforation. Please see axial image 62. Vascular/Lymphatic: Minimal stranding of central mesenteric fat with minimal congested mesenteric vessels centrally axial image 37 stable from prior exam probable chronic mild edema or inflammation. No adenopathy. No aortic aneurysm. Atherosclerotic calcifications of abdominal aorta and iliac arteries are noted Reproductive: No pelvic mass. Prostate gland measures 6.5 by 4.3 cm mild enlarged. Seminal vesicles are unremarkable. Other: No ascites or free abdominal air. Musculoskeletal: No  destructive bony lesions are noted. Sagittal images of the spine shows degenerative changes lumbar spine. Significant disc space flattening with vacuum disc phenomenon at L5-S1 level IMPRESSION: 1. There is acute appendicitis as described above. 2. **An incidental finding of potential clinical significance has been found. Enhanced liver shows indeterminate low-density lesion in right hepatic lobe centrally measures 1.7 cm. Indeterminate lesion in right hepatic lobe just above the gallbladder measures 1.2 cm. Further correlation with nonemergent enhanced MRI is recommended.** 3. There is a a right inguinal scrotal canal hernia containing omental fat measures 3.8 cm without evidence of acute complication. Please note there is pulling effect on the right anterior aspect of the urinary bladder by hernia without definite herniation of the bladder within inguinal canal. 4. Degenerative changes lumbar spine. 5. Mild enlarged prostate gland. Correlation with urology exam is recommended. These results were called by telephone at the time of interpretation on 03/24/2016 at 2:45 pm to Dr. Nanda Quinton , who verbally acknowledged these results. Electronically Signed   By: Lahoma Crocker M.D.   On: 03/24/2016 14:45      Assessment/Plan Acute appendicitis - CT significant for enlargement and thickening of the appendix wall and stranding of surrounding fat, appendix measures at least 1.4 cm in diameter, no definite perforation - WBC 13, afebrile  Incidental findings on CT:  1. Indeterminate low-density lesion in right hepatic lobe centrally measures 1.7 cm, as well as a lesion in right hepatic lobe just above the gallbladder measuring 1.2 cm 2. right inguinal scrotal canal hernia containing omental fat measures 3.8 cm without evidence of acute complication  HLD GERD  Plan - admit to med-surg and plan for OR later today for laparoscopic appendectomy. NPO, IVF, pain control, IV antibiotics (zosyn). Will discuss  incidental CT findings with MD.  Jerrye Beavers, [facility] 03/24/2016, 2:53 PM Pager: [phone] Consults: [phone] Mon-Fri 7:00 am-4:30 pm Sat-Sun 7:00 am-11:30 am  I have seen the patient and agree.  I have discussed lap appendectomy with him.    Kaylyn Lim, MD, FACS

## 2016-03-24 NOTE — ED Notes (Signed)
EKG completed by NT.

## 2016-03-24 NOTE — ED Notes (Signed)
Patient returning from CT

## 2016-03-24 NOTE — ED Triage Notes (Signed)
Patient c/o upper mid abdominal pain that began yesterday. Patient denies N/V/D

## 2016-03-24 NOTE — Op Note (Signed)
Surgeon: Wenda LowMatt Maxen Rowland, MD, FACS  Asst:  none  Anes:  general  Procedure: Laparoscopic appendectomy  Diagnosis: Necrotic appendicitis   Complications: none  EBL:   5 cc  Drains: none  Description of Procedure:  [facility] the evening of Tuesday, 03/24/16.  After anesthesia was administered and the patient was prepped a timeout was performed.  Zosyn had been given in ER.   Prep with Technicare and 5 mm Optiview through the left upper quadrant because of prior mesh umbilical hernia repair Magnus Ivan(Blackman).  Early recurrent RIH Derrell Lolling(Ramirez) noted with fat.  Appendix was dusky purple surrounded with brackish fluid.  Large and twisted, I transected the mesentery to allow visualization of the base.  Vascular load 4.5 mm stapler applied to resect.  No bleeding noted.  Appendix placed in a plastic bag and brought out through the oblique 12 mm port int he left lower quadrant.  R upper quadrant 5 and left upper quadrant five mm trocars.  Port sites injected with Exparel and closed with 4-0 Monocryl.    The patient tolerated the procedure well and was taken to the PACU in stable condition.     Matt B. Daphine DeutscherMartin, MD, [facility], [place] [phone]

## 2016-03-24 NOTE — ED Provider Notes (Signed)
Emergency Department Provider Note   I have reviewed the triage vital signs and the nursing notes.   HISTORY  Chief Complaint Abdominal Pain   HPI Pedro Castaneda is a 60 y.o. male with PMH of HLD and kidney stones presents to the emergency department for evaluation of acute onset epigastric abdominal pain. The patient denies any associated nausea or vomiting. He reports that pain began yesterday evening and has worsened throughout the night. He did not get much sleep. He made an appointment with his primary care doctor who referred him immediately to the emergency department if his level of tenderness. Patient denies any fevers. He continues to have normal bowel movements with no diarrhea. No chest pain or difficulty breathing. No back discomfort. Pain is made worse with palpation. No alleviating factors.   Past Medical History:  Diagnosis Date  . ED (erectile dysfunction)   . HLD (hyperlipidemia)    diet Rx  . Kidney stones 1996   kidney stones removal    Patient Active Problem List   Diagnosis Date Noted  . Chest pain, atypical 04/12/2015  . Solitary pulmonary nodule 04/11/2015    Past Surgical History:  Procedure Laterality Date  . ELBOW SURGERY  1973  . GANGLION CYST EXCISION  1982   Rt Foot  . INGUINAL HERNIA REPAIR    . KNEE SURGERY  2011   R knee arthroscopic  . LITHOTRIPSY    . NEPHROSTOMY    . UMBILICAL HERNIA REPAIR      Current Outpatient Rx  . Order #: [id number] Class: Historical Med  . Order #: [id number] Class: Historical Med  . Order #: [id number] Class: Historical Med  . Order #: [id number] Class: Historical Med  . Order #: [id number] Class: Historical Med  . Order #: [id number] Class: Historical Med  . Order #: [id number] Class: Historical Med  . Order #: [id number] Class: Historical Med  . Order #: [id number] Class: Historical Med  . Order #: [id number] Class: Historical Med  . Order #: [id number] Class: No Print    Allergies Patient has no known  allergies.  Family History  Problem Relation Age of Onset  . Breast cancer Mother   . Atrial fibrillation Father   . Heart attack Cousin     Social History Social History  Substance Use Topics  . Smoking status: Former Smoker    Packs/day: 0.25    Years: 1.00    Types: Cigarettes    Quit date: 04/07/1975  . Smokeless tobacco: Never Used  . Alcohol use No    Review of Systems  Constitutional: No fever/chills Eyes: No visual changes. ENT: No sore throat. Cardiovascular: Denies chest pain. Respiratory: Denies shortness of breath. Gastrointestinal: No abdominal pain.  No nausea, no vomiting.  No diarrhea.  No constipation. Genitourinary: Negative for dysuria. Musculoskeletal: Negative for back pain. Skin: Negative for rash. Neurological: Negative for headaches, focal weakness or numbness.  10-point ROS otherwise negative.  ____________________________________________   PHYSICAL EXAM:  VITAL SIGNS: ED Triage Vitals  Enc Vitals Group     BP 03/24/16 1201 148/98     Pulse Rate 03/24/16 1201 77     Resp 03/24/16 1201 18     Temp 03/24/16 1201 97.8 F (36.6 C)     Temp Source 03/24/16 1201 Oral     SpO2 03/24/16 1201 97 %     Weight 03/24/16 1204 210 lb (95.3 kg)     Height 03/24/16 1204 5' 9.5" (1.765 m)     Pain Score 03/24/16 1204 10   Constitutional: Alert and  oriented. Well appearing and in no acute distress. Eyes: Conjunctivae are normal.  Head: Atraumatic. Nose: No congestion/rhinnorhea. Mouth/Throat: Mucous membranes are moist.  Oropharynx non-erythematous. Neck: No stridor.  Cardiovascular: Normal rate, regular rhythm. Good peripheral circulation. Grossly normal heart sounds.   Respiratory: Normal respiratory effort.  No retractions. Lungs CTAB. Gastrointestinal: Soft with LUQ tenderness and guarding. No rebound. No distention.  Musculoskeletal: No lower extremity tenderness nor edema. No gross deformities of extremities. Neurologic:  Normal speech and  language. No gross focal neurologic deficits are appreciated.  Skin:  Skin is warm, dry and intact. No rash noted. ____________________________________________   LABS (all labs ordered are listed, but only abnormal results are displayed)  Labs Reviewed  COMPREHENSIVE METABOLIC PANEL - Abnormal; Notable for the following:       Result Value   Glucose, Bld 119 (*)    All other components within normal limits  CBC WITH DIFFERENTIAL/PLATELET - Abnormal; Notable for the following:    WBC 13.0 (*)    Neutro Abs 11.1 (*)    All other components within normal limits  URINALYSIS, ROUTINE W REFLEX MICROSCOPIC - Abnormal; Notable for the following:    Ketones, ur 5 (*)    All other components within normal limits  LIPASE, BLOOD   ____________________________________________  RADIOLOGY  Ct Abdomen Pelvis W Contrast  Result Date: 03/24/2016 CLINICAL DATA:  Epigastric pain starting yesterday EXAM: CT ABDOMEN AND PELVIS WITH CONTRAST TECHNIQUE: Multidetector CT imaging of the abdomen and pelvis was performed using the standard protocol following bolus administration of intravenous contrast. CONTRAST:  75mL ISOVUE-300 IOPAMIDOL (ISOVUE-300) INJECTION 61% COMPARISON:  04/24/2015 FINDINGS: Lower chest: Lung bases are normal. Hepatobiliary: Enhanced liver shows indeterminate low-density lesion in right hepatic lobe centrally measures 1.7 cm. Indeterminate lesion in right hepatic lobe just above the gallbladder measures 1.2 cm. Further correlation with nonemergent enhanced MRI is recommended. Pancreas: Enhanced pancreas is normal. Spleen: Enhanced spleen is normal. Adrenals/Urinary Tract: No adrenal gland mass. Enhanced kidneys are symmetrical in size. There is a left renal cyst measures 9 mm. Delayed renal images shows bilateral renal symmetrical excretion. No hydronephrosis or hydroureter. Bilateral visualized proximal ureter is unremarkable. The urinary bladder shows triangular deformity in right anterior  aspect due to pulling effect of a right inguinal scrotal hernia containing fat. No evidence of acute complication. Stomach/Bowel: No small bowel obstruction. There is significant enlargement and thickening of the appendix wall and stranding of surrounding fat. The appendix measures at least 1.4 cm in diameter. Findings are consistent with acute appendicitis. No definite evidence of perforation. Please see axial image 62. Vascular/Lymphatic: Minimal stranding of central mesenteric fat with minimal congested mesenteric vessels centrally axial image 37 stable from prior exam probable chronic mild edema or inflammation. No adenopathy. No aortic aneurysm. Atherosclerotic calcifications of abdominal aorta and iliac arteries are noted Reproductive: No pelvic mass. Prostate gland measures 6.5 by 4.3 cm mild enlarged. Seminal vesicles are unremarkable. Other: No ascites or free abdominal air. Musculoskeletal: No destructive bony lesions are noted. Sagittal images of the spine shows degenerative changes lumbar spine. Significant disc space flattening with vacuum disc phenomenon at L5-S1 level IMPRESSION: 1. There is acute appendicitis as described above. 2. **An incidental finding of potential clinical significance has been found. Enhanced liver shows indeterminate low-density lesion in right hepatic lobe centrally measures 1.7 cm. Indeterminate lesion in right hepatic lobe just above the gallbladder measures 1.2 cm. Further correlation with nonemergent enhanced MRI is recommended.** 3. There is a a right inguinal scrotal canal  hernia containing omental fat measures 3.8 cm without evidence of acute complication. Please note there is pulling effect on the right anterior aspect of the urinary bladder by hernia without definite herniation of the bladder within inguinal canal. 4. Degenerative changes lumbar spine. 5. Mild enlarged prostate gland. Correlation with urology exam is recommended. These results were called by telephone  at the time of interpretation on 03/24/2016 at 2:45 pm to Dr. Alona BeneJOSHUA Shaida Route , who verbally acknowledged these results. Electronically Signed   By: Natasha MeadLiviu  Pop M.D.   On: 03/24/2016 14:45    ____________________________________________   PROCEDURES  Procedure(s) performed:   Procedures  None ____________________________________________   INITIAL IMPRESSION / ASSESSMENT AND PLAN / ED COURSE  Pertinent labs & imaging results that were available during my care of the patient were reviewed by me and considered in my medical decision making (see chart for details).  Patient resents to the emergency room in for evaluation of significant epigastric to left upper quadrant pain. He has significant guarding on exam. No rebound tenderness. No chest pain or difficulty breathing. Low suspicion for anginal equivalent given his level of pain to palpation. He is not tender over his ribs. Plan for baseline labs and CT scan of the abdomen and pelvis along with IVF and pain control.   Spoke with general surgery and the patient to update them regarding the patient's CT scan findings. Last PO was yesterday evening. Will start abx and plan for surgery admission.   ____________________________________________  FINAL CLINICAL IMPRESSION(S) / ED DIAGNOSES  Final diagnoses:  Acute appendicitis, unspecified acute appendicitis type     MEDICATIONS GIVEN DURING THIS VISIT:  Medications  iopamidol (ISOVUE-300) 61 % injection (not administered)  piperacillin-tazobactam (ZOSYN) IVPB 3.375 g (3.375 g Intravenous New Bag/Given 03/24/16 1511)  sodium chloride 0.9 % bolus 500 mL (0 mLs Intravenous Stopped 03/24/16 1330)  morphine 4 MG/ML injection 4 mg (4 mg Intravenous Given 03/24/16 1244)  iopamidol (ISOVUE-300) 61 % injection 75 mL (75 mLs Intravenous Contrast Given 03/24/16 1415)     NEW OUTPATIENT MEDICATIONS STARTED DURING THIS VISIT:  None   Note:  This document was prepared using Dragon voice  recognition software and may include unintentional dictation errors.  Alona BeneJoshua Rick Warnick, MD Emergency Medicine   Maia PlanJoshua G Marvine Encalade, MD 03/24/16 [phone]

## 2016-03-24 NOTE — ED Notes (Signed)
Surgical PA at bedside  

## 2016-03-24 NOTE — Anesthesia Procedure Notes (Signed)
Procedure Name: Intubation Date/Time: 03/24/2016 6:08 PM Performed by: Delphia GratesHANDLER, Theora Vankirk Pre-anesthesia Checklist: Emergency Drugs available, Suction available, Patient being monitored and Patient identified Patient Re-evaluated:Patient Re-evaluated prior to inductionOxygen Delivery Method: Circle system utilized Preoxygenation: Pre-oxygenation with 100% oxygen Intubation Type: IV induction, Cricoid Pressure applied and Rapid sequence Laryngoscope Size: Mac and 4 Grade View: Grade II Tube type: Oral Tube size: 7.5 mm Number of attempts: 1 Airway Equipment and Method: Stylet Placement Confirmation: ETT inserted through vocal cords under direct vision,  positive ETCO2 and breath sounds checked- equal and bilateral Secured at: 22 cm Tube secured with: Tape Dental Injury: Teeth and Oropharynx as per pre-operative assessment

## 2016-03-24 NOTE — Anesthesia Postprocedure Evaluation (Signed)
Anesthesia Post Note  Patient: Curley Spicehomas L Uffelman  Procedure(s) Performed: Procedure(s) (LRB): APPENDECTOMY LAPAROSCOPIC (N/A)  Patient location during evaluation: PACU Anesthesia Type: General Level of consciousness: awake Pain management: pain level controlled Vital Signs Assessment: post-procedure vital signs reviewed and stable Respiratory status: spontaneous breathing Anesthetic complications: no       Last Vitals:  Vitals:   03/24/16 1930 03/24/16 1945  BP: (!) 151/88 (!) 156/101  Pulse: 65 73  Resp: (!) 22 (!) 22  Temp:  36.9 C    Last Pain:  Vitals:   03/24/16 1622  TempSrc:   PainSc: 9                  Brandy Kabat

## 2016-03-24 NOTE — Transfer of Care (Signed)
Immediate Anesthesia Transfer of Care Note  Patient: Pedro Castaneda  Procedure(s) Performed: Procedure(s): APPENDECTOMY LAPAROSCOPIC (N/A)  Patient Location: PACU  Anesthesia Type:General  Level of Consciousness: awake, alert , oriented and patient cooperative  Airway & Oxygen Therapy: Patient Spontanous Breathing and Patient connected to face mask oxygen  Post-op Assessment: Report given to RN and Post -op Vital signs reviewed and stable  Post vital signs: Reviewed and stable  Last Vitals:  Vitals:   03/24/16 1431 03/24/16 1621  BP: 182/87 157/91  Pulse: 60 69  Resp: 16 11  Temp:      Last Pain:  Vitals:   03/24/16 1622  TempSrc:   PainSc: 9          Complications: No apparent anesthesia complications

## 2016-03-24 NOTE — ED Notes (Signed)
Per PCP-sending patient over for epigastric pain, sudden onset-states concern for pancreatitis or chole

## 2016-03-25 ENCOUNTER — Encounter (HOSPITAL_COMMUNITY): Payer: Self-pay | Admitting: Surgery

## 2016-03-25 DIAGNOSIS — K219 Gastro-esophageal reflux disease without esophagitis: Secondary | ICD-10-CM | POA: Diagnosis present

## 2016-03-25 DIAGNOSIS — N400 Enlarged prostate without lower urinary tract symptoms: Secondary | ICD-10-CM | POA: Diagnosis present

## 2016-03-25 DIAGNOSIS — Z87891 Personal history of nicotine dependence: Secondary | ICD-10-CM | POA: Diagnosis not present

## 2016-03-25 DIAGNOSIS — E785 Hyperlipidemia, unspecified: Secondary | ICD-10-CM | POA: Diagnosis present

## 2016-03-25 DIAGNOSIS — K3580 Unspecified acute appendicitis: Secondary | ICD-10-CM | POA: Diagnosis present

## 2016-03-25 LAB — CREATININE, SERUM: CREATININE: 1.16 mg/dL (ref 0.61–1.24)

## 2016-03-25 LAB — CBC
HCT: 42.7 % (ref 39.0–52.0)
HCT: 42 % (ref 39.0–52.0)
HEMOGLOBIN: 14.9 g/dL (ref 13.0–17.0)
Hemoglobin: 14.5 g/dL (ref 13.0–17.0)
MCH: 30.3 pg (ref 26.0–34.0)
MCH: 29.7 pg (ref 26.0–34.0)
MCHC: 34.9 g/dL (ref 30.0–36.0)
MCHC: 34.5 g/dL (ref 30.0–36.0)
MCV: 87 fL (ref 78.0–100.0)
MCV: 86.1 fL (ref 78.0–100.0)
PLATELETS: 242 10*3/uL (ref 150–400)
PLATELETS: 233 10*3/uL (ref 150–400)
RBC: 4.91 MIL/uL (ref 4.22–5.81)
RBC: 4.88 MIL/uL (ref 4.22–5.81)
RDW: 12.8 % (ref 11.5–15.5)
RDW: 12.7 % (ref 11.5–15.5)
WBC: 11.9 10*3/uL — AB (ref 4.0–10.5)
WBC: 14.2 10*3/uL — AB (ref 4.0–10.5)

## 2016-03-25 MED ORDER — ACETAMINOPHEN 325 MG PO TABS: 650.0000 mg | ORAL_TABLET | Freq: Four times a day (QID) | ORAL | Status: DC | PRN

## 2016-03-25 NOTE — Progress Notes (Signed)
Patient ID: Pedro Castaneda, male   DOB: [birth year], 60 y.o.   MRN: [id number]  [facility]  1 Day Post-Op  Subjective: Feeling great, wants to go home. Tolerating regular diet and passing flatus. Has walked several laps around the unit. Has not taken any pain medication since yesterday.  Objective: Vital signs in last 24 hours: Temp:  [97.7 F (36.5 C)-99 F (37.2 C)] 98.9 F (37.2 C) (12/20 0500) Pulse Rate:  [56-107] 100 (12/20 0500) Resp:  [11-22] 20 (12/20 0500) BP: (131-182)/(72-103) 133/74 (12/20 0500) SpO2:  [93 %-100 %] 99 % (12/20 0500) Weight:  [210 lb (95.3 kg)] 210 lb (95.3 kg) (12/19 1204) Last BM Date: 03/24/16  Intake/Output from previous day: 12/19 0701 - 12/20 0700 In: 1600 [I.V.:1000; IV Piggyback:600] Out: 1150 [Urine:1100; Blood:50] Intake/Output this shift: No intake/output data recorded.  PE: Gen:  Alert, NAD, pleasant Card:  RRR, no M/G/R heard Pulm:  CTAB, no W/R/R Abd: Soft, NT/ND, +BS, incisions C/D/I  Lab Results:   Recent Labs  03/24/16 2044 03/25/16 0502  WBC 11.9* 14.2*  HGB 14.9 14.5  HCT 42.7 42.0  PLT 242 233   BMET  Recent Labs  03/24/16 1242 03/24/16 2044  NA 138  --   K 3.9  --   CL 105  --   CO2 26  --   GLUCOSE 119*  --   BUN 11  --   CREATININE 0.99 1.16  CALCIUM 9.2  --    PT/INR No results for input(s): LABPROT, INR in the last 72 hours. CMP     Component Value Date/Time   NA 138 03/24/2016 1242   K 3.9 03/24/2016 1242   CL 105 03/24/2016 1242   CO2 26 03/24/2016 1242   GLUCOSE 119 (H) 03/24/2016 1242   BUN 11 03/24/2016 1242   CREATININE 1.16 03/24/2016 2044   CALCIUM 9.2 03/24/2016 1242   PROT 7.2 03/24/2016 1242   ALBUMIN 4.0 03/24/2016 1242   AST 24 03/24/2016 1242   ALT 27 03/24/2016 1242   ALKPHOS 102 03/24/2016 1242   BILITOT 1.2 03/24/2016 1242   GFRNONAA >60 03/24/2016 2044   GFRAA >60 03/24/2016 2044   Lipase     Component Value Date/Time   LIPASE 22  03/24/2016 1242       Studies/Results: Ct Abdomen Pelvis W Contrast  Result Date: 03/24/2016 CLINICAL DATA:  Epigastric pain starting yesterday EXAM: CT ABDOMEN AND PELVIS WITH CONTRAST TECHNIQUE: Multidetector CT imaging of the abdomen and pelvis was performed using the standard protocol following bolus administration of intravenous contrast. CONTRAST:  75mL ISOVUE-300 IOPAMIDOL (ISOVUE-300) INJECTION 61% COMPARISON:  04/24/2015 FINDINGS: Lower chest: Lung bases are normal. Hepatobiliary: Enhanced liver shows indeterminate low-density lesion in right hepatic lobe centrally measures 1.7 cm. Indeterminate lesion in right hepatic lobe just above the gallbladder measures 1.2 cm. Further correlation with nonemergent enhanced MRI is recommended. Pancreas: Enhanced pancreas is normal. Spleen: Enhanced spleen is normal. Adrenals/Urinary Tract: No adrenal gland mass. Enhanced kidneys are symmetrical in size. There is a left renal cyst measures 9 mm. Delayed renal images shows bilateral renal symmetrical excretion. No hydronephrosis or hydroureter. Bilateral visualized proximal ureter is unremarkable. The urinary bladder shows triangular deformity in right anterior aspect due to pulling effect of a right inguinal scrotal hernia containing fat. No evidence of acute complication. Stomach/Bowel: No small bowel obstruction. There is significant enlargement and thickening of the appendix wall and stranding of surrounding fat. The appendix measures at least 1.4 cm  in diameter. Findings are consistent with acute appendicitis. No definite evidence of perforation. Please see axial image 62. Vascular/Lymphatic: Minimal stranding of central mesenteric fat with minimal congested mesenteric vessels centrally axial image 37 stable from prior exam probable chronic mild edema or inflammation. No adenopathy. No aortic aneurysm. Atherosclerotic calcifications of abdominal aorta and iliac arteries are noted Reproductive: No pelvic  mass. Prostate gland measures 6.5 by 4.3 cm mild enlarged. Seminal vesicles are unremarkable. Other: No ascites or free abdominal air. Musculoskeletal: No destructive bony lesions are noted. Sagittal images of the spine shows degenerative changes lumbar spine. Significant disc space flattening with vacuum disc phenomenon at L5-S1 level IMPRESSION: 1. There is acute appendicitis as described above. 2. **An incidental finding of potential clinical significance has been found. Enhanced liver shows indeterminate low-density lesion in right hepatic lobe centrally measures 1.7 cm. Indeterminate lesion in right hepatic lobe just above the gallbladder measures 1.2 cm. Further correlation with nonemergent enhanced MRI is recommended.** 3. There is a a right inguinal scrotal canal hernia containing omental fat measures 3.8 cm without evidence of acute complication. Please note there is pulling effect on the right anterior aspect of the urinary bladder by hernia without definite herniation of the bladder within inguinal canal. 4. Degenerative changes lumbar spine. 5. Mild enlarged prostate gland. Correlation with urology exam is recommended. These results were called by telephone at the time of interpretation on 03/24/2016 at 2:45 pm to Dr. Alona BeneJOSHUA LONG , who verbally acknowledged these results. Electronically Signed   By: Natasha MeadLiviu  Pop M.D.   On: 03/24/2016 14:45    Anti-infectives: Anti-infectives    Start     Dose/Rate Route Frequency Ordered Stop   03/24/16 2245  piperacillin-tazobactam (ZOSYN) IVPB 3.375 g  Status:  Discontinued     3.375 g 12.5 mL/hr over 240 Minutes Intravenous Every 8 hours 03/24/16 1703 03/24/16 2050   03/24/16 2200  piperacillin-tazobactam (ZOSYN) IVPB 3.375 g     3.375 g 12.5 mL/hr over 240 Minutes Intravenous Every 8 hours 03/24/16 2050     03/24/16 1445  piperacillin-tazobactam (ZOSYN) IVPB 3.375 g     3.375 g 12.5 mL/hr over 240 Minutes Intravenous  Once 03/24/16 1434 03/24/16 1911        Assessment/Plan Necrotic appendicitis  S/p Laparoscopic appendectomy 12/19 Dr. Daphine DeutscherMartin - POD 1 - afebrile  HLD - not on medication GERD - protonix  ID - zosyn 12/19>> FEN - IVF, regular diet VTE - Heparin, SCDs  Plan - progressing well. He is tolerating regular diet and passing flatus. He has ambulated and not taken any pain medication today. Wants to go home but needs some more time on IV zosyn.    LOS: 0 days    Edson SnowballBROOKE A MILLER , [facility] 03/25/2016, 9:00 AM Pager: [phone] Consults: [phone] Mon-Fri 7:00 am-4:30 pm Sat-Sun 7:00 am-11:30 am

## 2016-03-26 LAB — CBC
HCT: 37.8 % — ABNORMAL LOW (ref 39.0–52.0)
Hemoglobin: 13.2 g/dL (ref 13.0–17.0)
MCH: 30.3 pg (ref 26.0–34.0)
MCHC: 34.9 g/dL (ref 30.0–36.0)
MCV: 86.9 fL (ref 78.0–100.0)
PLATELETS: 204 10*3/uL (ref 150–400)
RBC: 4.35 MIL/uL (ref 4.22–5.81)
RDW: 12.9 % (ref 11.5–15.5)
WBC: 10 10*3/uL (ref 4.0–10.5)

## 2016-03-26 MED ORDER — AMOXICILLIN-POT CLAVULANATE 875-125 MG PO TABS: 1.0000 | ORAL_TABLET | Freq: Two times a day (BID) | ORAL | 0 refills | Status: DC

## 2016-03-26 NOTE — Progress Notes (Signed)
Discharge instructions discussed with patient and wife, verbalized agreement and understanding  

## 2016-03-26 NOTE — Discharge Instructions (Signed)

## 2016-03-26 NOTE — Discharge Summary (Signed)
[facility] Discharge Summary   Patient ID: Pedro Castaneda MRN: [id number] DOB/AGE: [birth year] 58 y.o.  Admit date: 03/24/2016 Discharge date: 03/26/2016  Admitting Diagnosis: Acute appendicitis  Discharge Diagnosis Patient Active Problem List   Diagnosis Date Noted  . Acute appendicitis 03/24/2016  . Chest pain, atypical 04/12/2015  . Solitary pulmonary nodule 04/11/2015    Consultants None  Imaging: Ct Abdomen Pelvis W Contrast  Result Date: 03/24/2016 CLINICAL DATA:  Epigastric pain starting yesterday EXAM: CT ABDOMEN AND PELVIS WITH CONTRAST TECHNIQUE: Multidetector CT imaging of the abdomen and pelvis was performed using the standard protocol following bolus administration of intravenous contrast. CONTRAST:  75mL ISOVUE-300 IOPAMIDOL (ISOVUE-300) INJECTION 61% COMPARISON:  04/24/2015 FINDINGS: Lower chest: Lung bases are normal. Hepatobiliary: Enhanced liver shows indeterminate low-density lesion in right hepatic lobe centrally measures 1.7 cm. Indeterminate lesion in right hepatic lobe just above the gallbladder measures 1.2 cm. Further correlation with nonemergent enhanced MRI is recommended. Pancreas: Enhanced pancreas is normal. Spleen: Enhanced spleen is normal. Adrenals/Urinary Tract: No adrenal gland mass. Enhanced kidneys are symmetrical in size. There is a left renal cyst measures 9 mm. Delayed renal images shows bilateral renal symmetrical excretion. No hydronephrosis or hydroureter. Bilateral visualized proximal ureter is unremarkable. The urinary bladder shows triangular deformity in right anterior aspect due to pulling effect of a right inguinal scrotal hernia containing fat. No evidence of acute complication. Stomach/Bowel: No small bowel obstruction. There is significant enlargement and thickening of the appendix wall and stranding of surrounding fat. The appendix measures at least 1.4 cm in diameter. Findings are consistent with acute appendicitis. No  definite evidence of perforation. Please see axial image 62. Vascular/Lymphatic: Minimal stranding of central mesenteric fat with minimal congested mesenteric vessels centrally axial image 37 stable from prior exam probable chronic mild edema or inflammation. No adenopathy. No aortic aneurysm. Atherosclerotic calcifications of abdominal aorta and iliac arteries are noted Reproductive: No pelvic mass. Prostate gland measures 6.5 by 4.3 cm mild enlarged. Seminal vesicles are unremarkable. Other: No ascites or free abdominal air. Musculoskeletal: No destructive bony lesions are noted. Sagittal images of the spine shows degenerative changes lumbar spine. Significant disc space flattening with vacuum disc phenomenon at L5-S1 level IMPRESSION: 1. There is acute appendicitis as described above. 2. **An incidental finding of potential clinical significance has been found. Enhanced liver shows indeterminate low-density lesion in right hepatic lobe centrally measures 1.7 cm. Indeterminate lesion in right hepatic lobe just above the gallbladder measures 1.2 cm. Further correlation with nonemergent enhanced MRI is recommended.** 3. There is a a right inguinal scrotal canal hernia containing omental fat measures 3.8 cm without evidence of acute complication. Please note there is pulling effect on the right anterior aspect of the urinary bladder by hernia without definite herniation of the bladder within inguinal canal. 4. Degenerative changes lumbar spine. 5. Mild enlarged prostate gland. Correlation with urology exam is recommended. These results were called by telephone at the time of interpretation on 03/24/2016 at 2:45 pm to Dr. Alona BeneJOSHUA LONG , who verbally acknowledged these results. Electronically Signed   By: Natasha MeadLiviu  Pop M.D.   On: 03/24/2016 14:45    Procedures Dr. Daphine DeutscherMartin (03/24/16) - Laparoscopic Appendectomy  Hospital Course:  Pedro Castaneda is a 60yo male who [facility] with acute onset abdominal  pain.  Workup showed acute appendicitis.  Patient was admitted and underwent procedure listed above.  Intraoperatively the appendix was found to be necrotic. Tolerated procedure well and was transferred to the  floor.  Diet was advanced as tolerated.  On POD2 the patient was voiding well, tolerating diet, ambulating well, pain well controlled, WBC WNL, vital signs stable, incisions c/d/i and felt stable for discharge home.  Patient was discharged on 5 days of augmentin. He will follow up in our office in 2-3 weeks and knows to call with questions or concerns.     Physical Exam: Gen:  Alert, NAD, pleasant Card:  RRR, no M/G/R heard Pulm:  CTAB, no W/R/R Abd: Soft, NT/ND, +BS, incisions C/D/I  Allergies as of 03/26/2016   No Known Allergies     Medication List    TAKE these medications   ALFALFA PO 1 capsule daily.   amoxicillin-clavulanate 875-125 MG tablet Commonly known as:  AUGMENTIN Take 1 tablet by mouth 2 (two) times daily.   aspirin EC 81 MG tablet Take 1 tablet (81 mg total) by mouth daily. What changed:  Another medication with the same name was removed. Continue taking this medication, and follow the directions you see here.   famotidine 20 MG tablet Commonly known as:  PEPCID Take 20 mg by mouth daily as needed for heartburn or indigestion.   glucosamine-chondroitin 500-400 MG tablet Take 1,200 tablets by mouth 1 dose over 24 hours.   ibuprofen 200 MG tablet Commonly known as:  ADVIL,MOTRIN Take 800 mg by mouth every 6 (six) hours as needed for moderate pain.   multivitamin capsule Take 1 capsule by mouth daily.   RED YEAST RICE PO Take 1 tablet by mouth daily.   tadalafil 5 MG tablet Commonly known as:  CIALIS Take 5 mg by mouth daily as needed for erectile dysfunction.   VITAMIN B-12 PO Take 1 tablet by mouth daily.   vitamin C 500 MG tablet Commonly known as:  ASCORBIC ACID Take 500 mg by mouth daily.        Follow-up Information    [facility] [place] Go on 04/14/2016.   Specialty:  General Surgery Why:  Your appointment is 04/14/2016 at 10:30am. Please arrive 30 minutes prior to your appointment to check in and fill out necessary paperwork. Contact information: [address] [place] [id number] [phone]          Signed: Edson SnowballBROOKE A MILLER, [facility] 03/26/2016, 8:18 AM Pager: [phone] Consults: [phone] Mon-Fri 7:00 am-4:30 pm Sat-Sun 7:00 am-11:30 am

## 2016-04-01 DIAGNOSIS — G4730 Sleep apnea, unspecified: Secondary | ICD-10-CM | POA: Diagnosis not present

## 2016-04-02 DIAGNOSIS — G4733 Obstructive sleep apnea (adult) (pediatric): Secondary | ICD-10-CM | POA: Diagnosis not present

## 2016-04-02 DIAGNOSIS — L821 Other seborrheic keratosis: Secondary | ICD-10-CM | POA: Diagnosis not present

## 2016-04-02 DIAGNOSIS — D1801 Hemangioma of skin and subcutaneous tissue: Secondary | ICD-10-CM | POA: Diagnosis not present

## 2016-04-03 DIAGNOSIS — G4733 Obstructive sleep apnea (adult) (pediatric): Secondary | ICD-10-CM | POA: Diagnosis not present

## 2016-04-24 DIAGNOSIS — S301XXA Contusion of abdominal wall, initial encounter: Secondary | ICD-10-CM | POA: Diagnosis not present

## 2016-07-02 DIAGNOSIS — G4733 Obstructive sleep apnea (adult) (pediatric): Secondary | ICD-10-CM | POA: Diagnosis not present

## 2016-10-02 DIAGNOSIS — G4733 Obstructive sleep apnea (adult) (pediatric): Secondary | ICD-10-CM | POA: Diagnosis not present

## 2016-11-01 DIAGNOSIS — G4733 Obstructive sleep apnea (adult) (pediatric): Secondary | ICD-10-CM | POA: Diagnosis not present

## 2016-12-02 DIAGNOSIS — G4733 Obstructive sleep apnea (adult) (pediatric): Secondary | ICD-10-CM | POA: Diagnosis not present

## 2017-01-02 DIAGNOSIS — G4733 Obstructive sleep apnea (adult) (pediatric): Secondary | ICD-10-CM | POA: Diagnosis not present

## 2017-01-11 DIAGNOSIS — G4733 Obstructive sleep apnea (adult) (pediatric): Secondary | ICD-10-CM | POA: Diagnosis not present

## 2017-01-14 DIAGNOSIS — H01021 Squamous blepharitis right upper eyelid: Secondary | ICD-10-CM | POA: Diagnosis not present

## 2017-01-14 DIAGNOSIS — H01022 Squamous blepharitis right lower eyelid: Secondary | ICD-10-CM | POA: Diagnosis not present

## 2017-01-14 DIAGNOSIS — H2513 Age-related nuclear cataract, bilateral: Secondary | ICD-10-CM | POA: Diagnosis not present

## 2017-01-14 DIAGNOSIS — H01024 Squamous blepharitis left upper eyelid: Secondary | ICD-10-CM | POA: Diagnosis not present

## 2017-02-08 ENCOUNTER — Other Ambulatory Visit: Payer: Self-pay | Admitting: Internal Medicine

## 2017-02-08 DIAGNOSIS — R911 Solitary pulmonary nodule: Secondary | ICD-10-CM

## 2017-04-02 ENCOUNTER — Ambulatory Visit (INDEPENDENT_AMBULATORY_CARE_PROVIDER_SITE_OTHER)
Admission: RE | Admit: 2017-04-02 | Discharge: 2017-04-02 | Disposition: A | Discharge status: Home / Self Care | Payer: BLUE CROSS/BLUE SHIELD | Source: Ambulatory Visit | Attending: Internal Medicine | Admitting: Internal Medicine

## 2017-04-02 DIAGNOSIS — R911 Solitary pulmonary nodule: Secondary | ICD-10-CM

## 2017-07-01 DIAGNOSIS — G4733 Obstructive sleep apnea (adult) (pediatric): Secondary | ICD-10-CM | POA: Diagnosis not present

## 2017-08-20 DIAGNOSIS — Z125 Encounter for screening for malignant neoplasm of prostate: Secondary | ICD-10-CM | POA: Diagnosis not present

## 2017-08-20 DIAGNOSIS — Z0000 Encounter for general adult medical examination without abnormal findings: Secondary | ICD-10-CM | POA: Diagnosis not present

## 2017-08-20 DIAGNOSIS — E7800 Pure hypercholesterolemia, unspecified: Secondary | ICD-10-CM | POA: Diagnosis not present

## 2017-08-20 DIAGNOSIS — Z23 Encounter for immunization: Secondary | ICD-10-CM | POA: Diagnosis not present

## 2017-11-03 ENCOUNTER — Ambulatory Visit (INDEPENDENT_AMBULATORY_CARE_PROVIDER_SITE_OTHER): Payer: Self-pay

## 2017-11-03 ENCOUNTER — Encounter (INDEPENDENT_AMBULATORY_CARE_PROVIDER_SITE_OTHER): Payer: Self-pay | Admitting: Orthopedic Surgery

## 2017-11-03 ENCOUNTER — Ambulatory Visit (INDEPENDENT_AMBULATORY_CARE_PROVIDER_SITE_OTHER): Payer: BLUE CROSS/BLUE SHIELD | Admitting: Orthopedic Surgery

## 2017-11-03 DIAGNOSIS — G8929 Other chronic pain: Secondary | ICD-10-CM

## 2017-11-03 DIAGNOSIS — M1711 Unilateral primary osteoarthritis, right knee: Secondary | ICD-10-CM

## 2017-11-03 DIAGNOSIS — M25561 Pain in right knee: Secondary | ICD-10-CM

## 2017-11-03 NOTE — Progress Notes (Signed)
Office Visit Note   Patient: Pedro Castaneda           Date of Birth: January 18, 1956           MRN: [id number] Visit Date: 11/03/2017 Requested by: Johny Blamer, MD [phone] [place], [place] [address] PCP: Johny Blamer, MD  Subjective: Chief Complaint  Patient presents with  . Right Knee - Pain    HPI: Pedro Castaneda is a patient with right knee pain.  Describes pain primarily on the medial aspect.  Denies any instability.  Reports some weakness and giving way.  He has 7 grandchildren with whom he likes to be very active.  He is doing the bike.  He states the pain is tolerable but he is looking for options for management.              ROS: All systems reviewed are negative as they relate to the chief complaint within the history of present illness.  Patient denies  fevers or chills.   Assessment & Plan: Visit Diagnoses:  1. Chronic pain of right knee   2. Unilateral primary osteoarthritis, right knee     Plan: Impression is right knee medial compartment arthritis with possibility of unicompartmental knee replacement giving him some relief.  We want to hold off on that intervention until absolutely necessary.  He does have enough flexibility on physical exam that he would likely open up enough on that medial side to consider that option.  We would need to check him next clinic visit with a stress view on that femur and knee to see if that medial compartment opens.  In the meantime I like to get an MRI scan to evaluate the lateral and patellofemoral compartments to make sure that there is no excessive wear in these compartments that would increase the risk of him not having a satisfactory result with unicompartmental knee replacement.  I will see him back after his scan.  He is going to avoid inflammatory foods and consider CBD oil as an adjunct to try to make his knee arthritis improved.  Follow-Up Instructions: Return for after MRI.   Orders:  Orders Placed This Encounter   Procedures  . XR KNEE 3 VIEW RIGHT  . MR Knee Right w/o contrast   No orders of the defined types were placed in this encounter.     Procedures: No procedures performed   Clinical Data: No additional findings.  Objective: Vital Signs: There were no vitals taken for this visit.  Physical Exam:   Constitutional: Patient appears well-developed HEENT:  Head: Normocephalic Eyes:EOM are normal Neck: Normal range of motion Cardiovascular: Normal rate Pulmonary/chest: Effort normal Neurologic: Patient is alert Skin: Skin is warm Psychiatric: Patient has normal mood and affect    Ortho Exam: Ortho exam demonstrates slight varus alignment right lower extremity palpable pedal pulses he has about a 5 degree flexion contracture on the right no flexion contracture on the left.  No effusion in the right knee today.  Collateral cruciate ligaments feel stable.  Extensor mechanism is intact.  No other masses lymph adenopathy or skin changes noted in the right knee region  Specialty Comments:  No specialty comments available.  Imaging: Xr Knee 3 View Right  Result Date: 11/03/2017 AP lateral merchant right knee reviewed.  Medial compartment arthritis is present.  Mild to moderate patellofemoral arthritis is noted.  Slight varus alignment present.  Medial joint space more narrowed on the right than the left.  No fracture  dislocation present.    PMFS History: Patient Active Problem List   Diagnosis Date Noted  . Acute appendicitis 03/24/2016  . Chest pain, atypical 04/12/2015  . Solitary pulmonary nodule 04/11/2015   Past Medical History:  Diagnosis Date  . ED (erectile dysfunction)   . HLD (hyperlipidemia)    diet Rx  . Kidney stones 1996   kidney stones removal    Family History  Problem Relation Age of Onset  . Breast cancer Mother   . Atrial fibrillation Father   . Heart attack Cousin     Past Surgical History:  Procedure Laterality Date  . ELBOW SURGERY  1973  .  GANGLION CYST EXCISION  1982   Rt Foot  . INGUINAL HERNIA REPAIR    . KNEE SURGERY  2011   R knee arthroscopic  . LAPAROSCOPIC APPENDECTOMY N/A 03/24/2016   Procedure: APPENDECTOMY LAPAROSCOPIC;  Surgeon: Luretha MurphyMatthew Martin, MD;  Location: [facility];  Service: General;  Laterality: N/A;  . LITHOTRIPSY    . NEPHROSTOMY    . UMBILICAL HERNIA REPAIR     Social History   Occupational History  . Occupation: Chief of Stafflooring  . Occupation: Real Estate  Tobacco Use  . Smoking status: Former Smoker    Packs/day: 0.25    Years: 1.00    Pack years: 0.25    Types: Cigarettes    Last attempt to quit: 04/07/1975    Years since quitting: 42.6  . Smokeless tobacco: Never Used  Substance and Sexual Activity  . Alcohol use: No    Alcohol/week: 0.0 oz  . Drug use: No  . Sexual activity: Not on file

## 2017-11-05 DIAGNOSIS — R1031 Right lower quadrant pain: Secondary | ICD-10-CM | POA: Diagnosis not present

## 2017-11-11 ENCOUNTER — Other Ambulatory Visit: Payer: Self-pay | Admitting: General Surgery

## 2017-11-11 ENCOUNTER — Ambulatory Visit
Admission: RE | Admit: 2017-11-11 | Discharge: 2017-11-11 | Disposition: A | Discharge status: Home / Self Care | Payer: BLUE CROSS/BLUE SHIELD | Source: Ambulatory Visit | Attending: Orthopedic Surgery | Admitting: Orthopedic Surgery

## 2017-11-11 ENCOUNTER — Telehealth (INDEPENDENT_AMBULATORY_CARE_PROVIDER_SITE_OTHER): Payer: Self-pay | Admitting: Orthopedic Surgery

## 2017-11-11 DIAGNOSIS — G8929 Other chronic pain: Secondary | ICD-10-CM

## 2017-11-11 DIAGNOSIS — M25461 Effusion, right knee: Secondary | ICD-10-CM | POA: Diagnosis not present

## 2017-11-11 DIAGNOSIS — M25561 Pain in right knee: Principal | ICD-10-CM

## 2017-11-11 DIAGNOSIS — K4090 Unilateral inguinal hernia, without obstruction or gangrene, not specified as recurrent: Secondary | ICD-10-CM

## 2017-11-11 NOTE — Telephone Encounter (Signed)
Patient called asked if Dr August Saucerean would call him with the results of his MRI? The number to contact patient is [phone]

## 2017-11-11 NOTE — Telephone Encounter (Signed)
Can you please advise on results?

## 2017-11-12 NOTE — Telephone Encounter (Signed)
I tried calling. LMVM for him to call me back to discuss.

## 2017-11-12 NOTE — Telephone Encounter (Signed)
No meniscal pathology, but advanced tricompartmental osteoarthritis.  Best to come in to talk to Pedro Castaneda about this when he gets back in town as it looks like he will need more of a tkr rather than partial like Pedro Castaneda had m entioned in his last note (of course, he needs to make that decision thought, not me)

## 2017-11-12 NOTE — Telephone Encounter (Signed)
FYI See notes below and review scan. Thanks.

## 2017-11-17 ENCOUNTER — Ambulatory Visit
Admission: RE | Admit: 2017-11-17 | Discharge: 2017-11-17 | Disposition: A | Discharge status: Home / Self Care | Payer: BLUE CROSS/BLUE SHIELD | Source: Ambulatory Visit | Attending: General Surgery | Admitting: General Surgery

## 2017-11-17 DIAGNOSIS — K4090 Unilateral inguinal hernia, without obstruction or gangrene, not specified as recurrent: Secondary | ICD-10-CM

## 2017-11-19 NOTE — Telephone Encounter (Signed)
I called.  We are going to get him set up with a DonJoy brace.  He knows when he and I gave the prescription to Toniann FailWendy so she can arrange it thanks

## 2017-11-19 NOTE — Telephone Encounter (Signed)
Do [facility] need to [facility] for [facility] measured for brace?

## 2017-11-21 NOTE — Telephone Encounter (Signed)
I emailed [facility] with Rx and info, and I advised patient that this was done.

## 2017-12-14 DIAGNOSIS — M1711 Unilateral primary osteoarthritis, right knee: Secondary | ICD-10-CM | POA: Diagnosis not present

## 2017-12-15 ENCOUNTER — Other Ambulatory Visit: Payer: Self-pay | Admitting: General Surgery

## 2017-12-15 DIAGNOSIS — K4001 Bilateral inguinal hernia, with obstruction, without gangrene, recurrent: Secondary | ICD-10-CM | POA: Diagnosis not present

## 2017-12-15 DIAGNOSIS — K4031 Unilateral inguinal hernia, with obstruction, without gangrene, recurrent: Secondary | ICD-10-CM | POA: Diagnosis not present

## 2017-12-15 DIAGNOSIS — K4090 Unilateral inguinal hernia, without obstruction or gangrene, not specified as recurrent: Secondary | ICD-10-CM | POA: Diagnosis not present

## 2018-02-07 IMAGING — CT CT ABD-PELV W/ CM
2 of 5 series · 15 of 46 positions shown, 17 images · IV contrast (ISOVUE)
Comparison: 04/24/2015

CLINICAL DATA: Epigastric pain starting yesterday

EXAM:
CT ABDOMEN AND PELVIS WITH CONTRAST
TECHNIQUE: Multidetector CT imaging of the abdomen and pelvis was performed
using the standard protocol following bolus administration of
intravenous contrast.
CONTRAST:  75mL GRD53O-EOO IOPAMIDOL (GRD53O-EOO) INJECTION 61%

[Series 2: abd/pel with · axial · 0.87mm/px · z∈[-512,-62]mm · 12 of 104 slices shown, 14 images]
[im 7/104  soft-tissue]
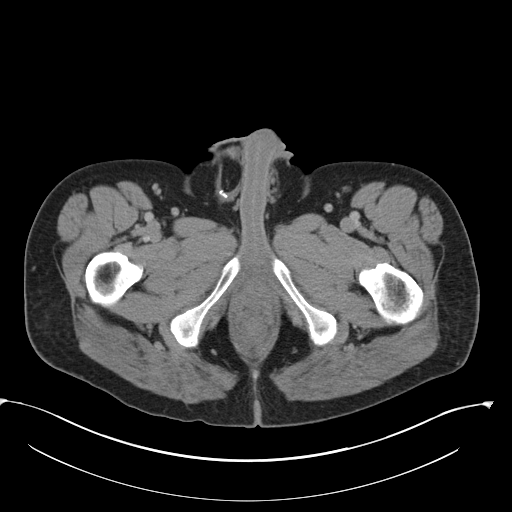
[im 7/104  bone]
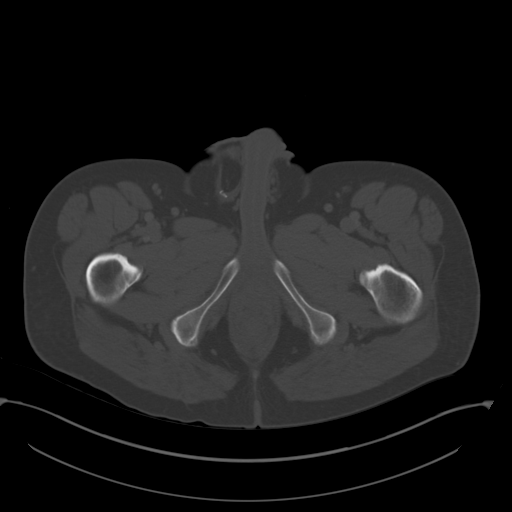
[im 14/104  soft-tissue]
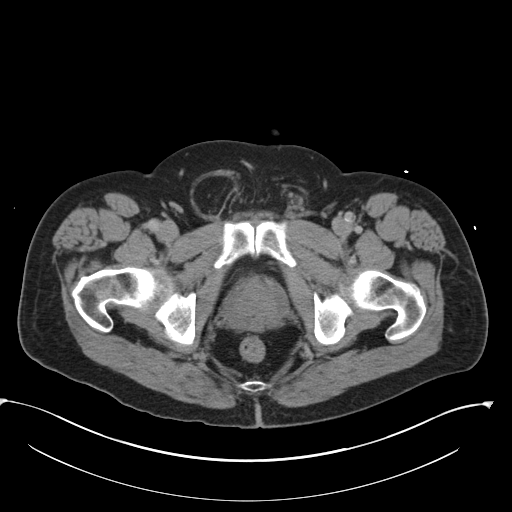
[im 21/104  soft-tissue]
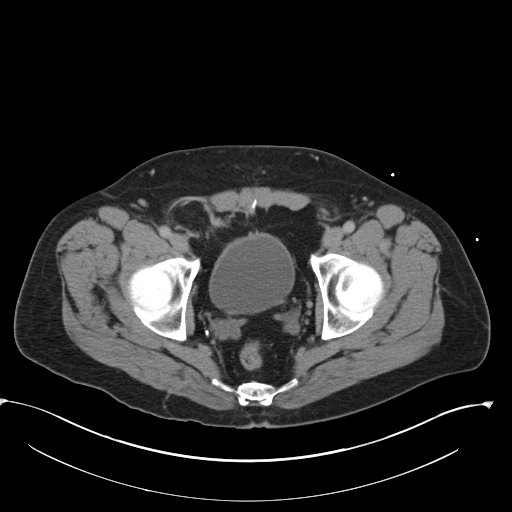
[im 35/104  soft-tissue]
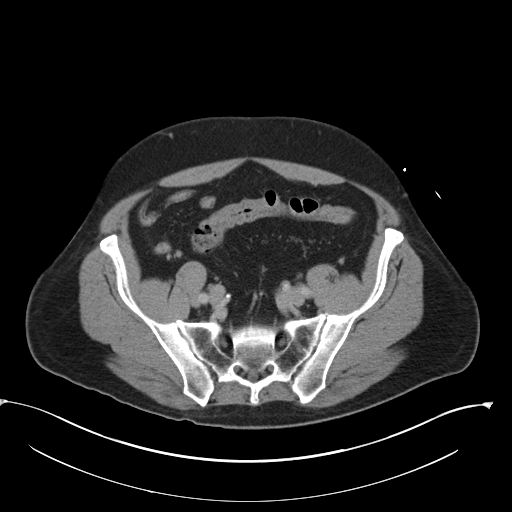
[im 42/104  soft-tissue]
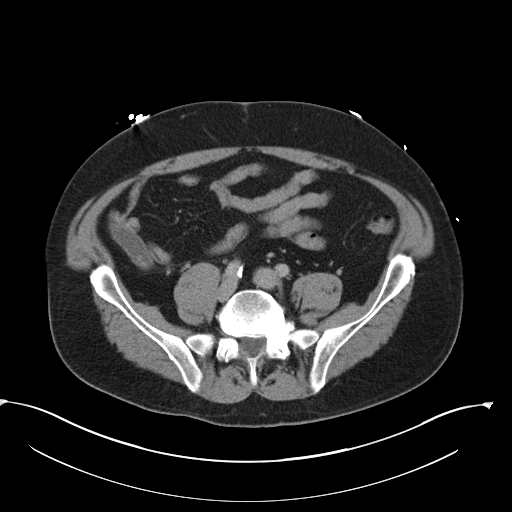
[im 49/104  soft-tissue]
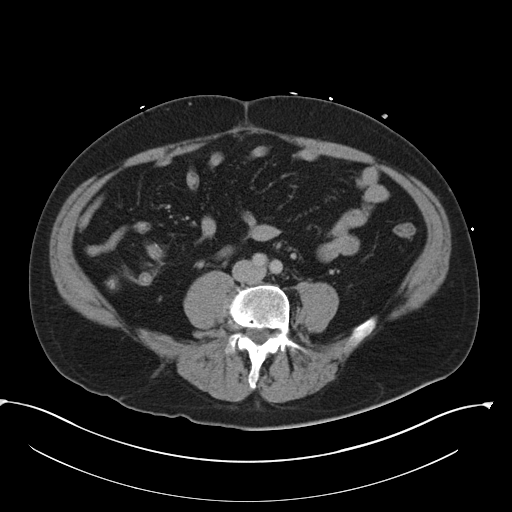
[im 55/104  soft-tissue]
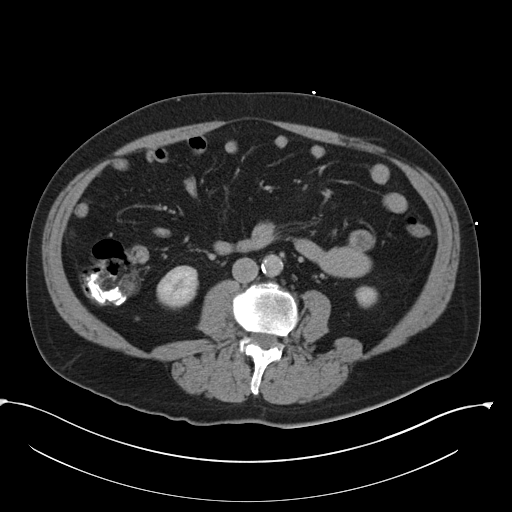
[im 62/104  soft-tissue]
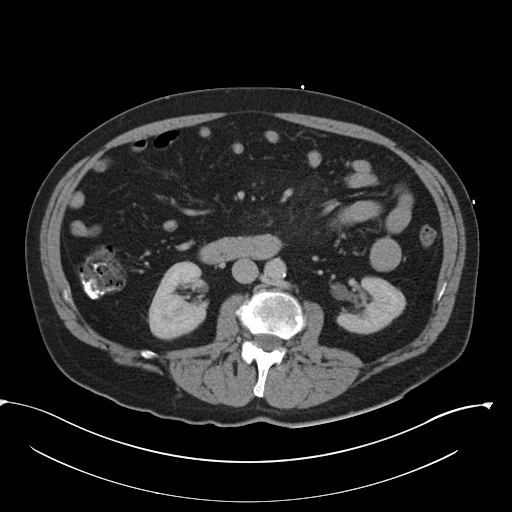
[im 69/104  soft-tissue]
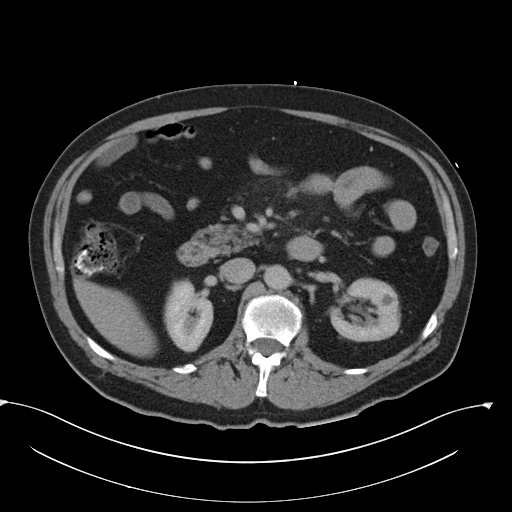
[im 69/104  bone]
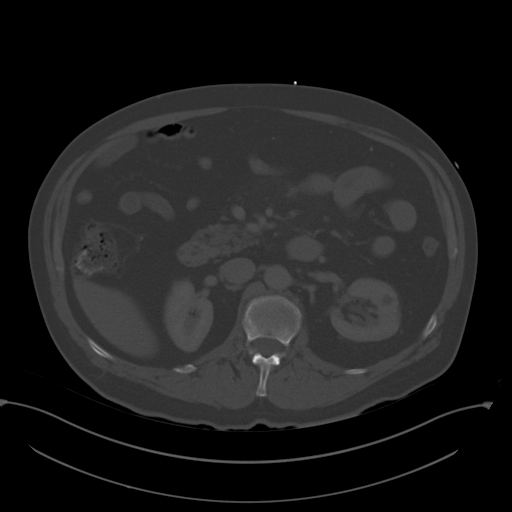
[im 83/104  soft-tissue]
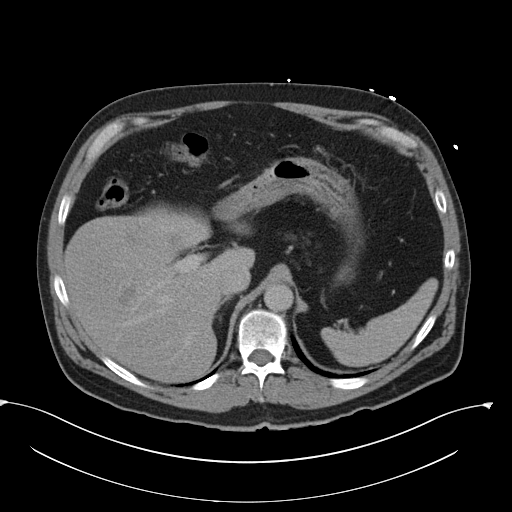
[im 90/104  soft-tissue]
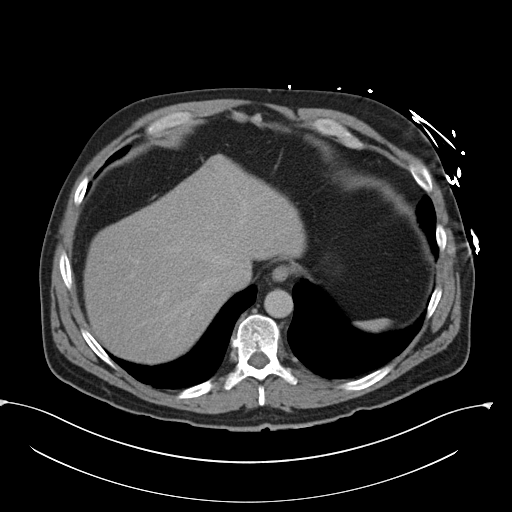
[im 97/104  soft-tissue]
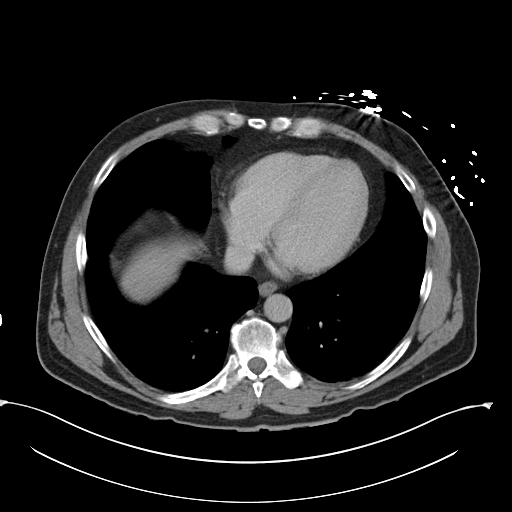

[Series 3: coronal a/|p · coronal · 0.84mm/px · 3 of 148 slices shown]
[im 50/148  soft-tissue]
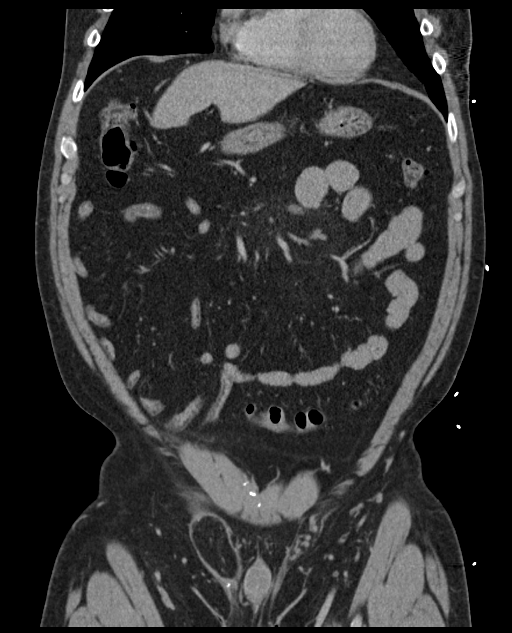
[im 66/148  soft-tissue]
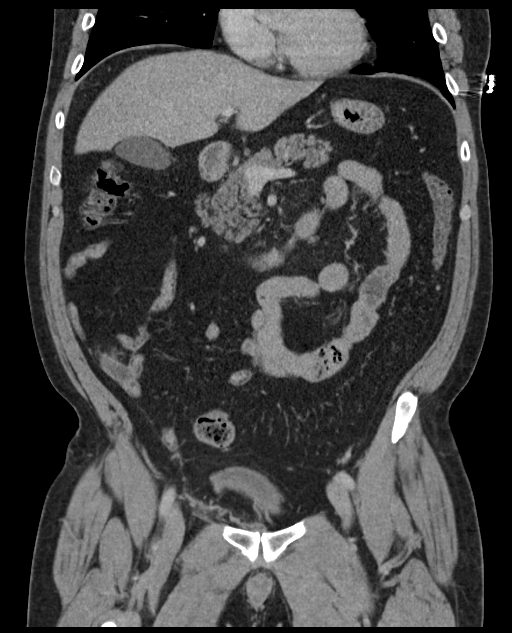
[im 82/148  soft-tissue]
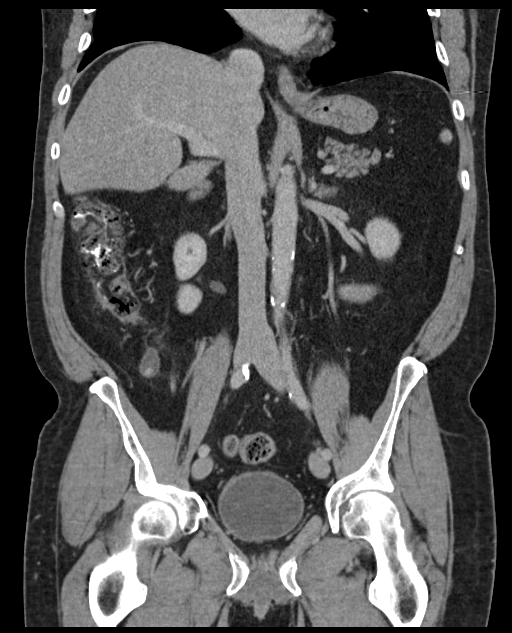

[15 of 46 positions shown; findings below may reference images not displayed]

FINDINGS: Lower chest: Lung bases are normal.

Hepatobiliary: Enhanced liver shows indeterminate low-density lesion
in right hepatic lobe centrally measures 1.7 cm. Indeterminate
lesion in right hepatic lobe just above the gallbladder measures
cm. Further correlation with nonemergent enhanced MRI is
recommended.

Pancreas: Enhanced pancreas is normal.

Spleen: Enhanced spleen is normal.

Adrenals/Urinary Tract: No adrenal gland mass. Enhanced kidneys are
symmetrical in size. There is a left renal cyst measures 9 mm.
Delayed renal images shows bilateral renal symmetrical excretion. No
hydronephrosis or hydroureter. Bilateral visualized proximal ureter
is unremarkable. The urinary bladder shows triangular deformity in
right anterior aspect due to pulling effect of a right inguinal
scrotal hernia containing fat. No evidence of acute complication.

Stomach/Bowel: No small bowel obstruction. There is significant
enlargement and thickening of the appendix wall and stranding of
surrounding fat. The appendix measures at least 1.4 cm in diameter.
Findings are consistent with acute appendicitis. No definite
evidence of perforation. Please see axial image 62.

Vascular/Lymphatic: Minimal stranding of central mesenteric fat with
minimal congested mesenteric vessels centrally axial image 37 stable
from prior exam probable chronic mild edema or inflammation. No
adenopathy. No aortic aneurysm. Atherosclerotic calcifications of
abdominal aorta and iliac arteries are noted

Reproductive: No pelvic mass. Prostate gland measures 6.5 by 4.3 cm
mild enlarged. Seminal vesicles are unremarkable.

Other: No ascites or free abdominal air.

Musculoskeletal: No destructive bony lesions are noted. Sagittal
images of the spine

shows degenerative changes lumbar spine. Significant disc space
flattening with vacuum disc phenomenon at L5-S1 level
IMPRESSION: 1. There is acute appendicitis as described above.
2. **An incidental finding of potential clinical significance has
been found. Enhanced liver shows indeterminate low-density lesion in
right hepatic lobe centrally measures 1.7 cm. Indeterminate lesion
in right hepatic lobe just above the gallbladder measures 1.2 cm.
Further correlation with nonemergent enhanced MRI is recommended.**
3. There is a a right inguinal scrotal canal hernia containing
omental fat measures 3.8 cm without evidence of acute complication.
Please note there is pulling effect on the right anterior aspect of
the urinary bladder by hernia without definite herniation of the
bladder within inguinal canal.
4. Degenerative changes lumbar spine.
5. Mild enlarged prostate gland. Correlation with urology exam is
recommended.

These results were called by telephone at the time of interpretation
on 03/24/2016 at [DATE] to Dr. MOB CARILLO , who verbally
acknowledged these results.

## 2018-04-12 ENCOUNTER — Ambulatory Visit (INDEPENDENT_AMBULATORY_CARE_PROVIDER_SITE_OTHER): Payer: BLUE CROSS/BLUE SHIELD | Admitting: Medical

## 2018-04-12 ENCOUNTER — Encounter: Payer: Self-pay | Admitting: Physician Assistant

## 2018-04-12 ENCOUNTER — Encounter (INDEPENDENT_AMBULATORY_CARE_PROVIDER_SITE_OTHER): Payer: Self-pay

## 2018-04-12 VITALS — BP 142/86 | HR 60 | Ht 70.0 in | Wt 212.0 lb

## 2018-04-12 DIAGNOSIS — R030 Elevated blood-pressure reading, without diagnosis of hypertension: Secondary | ICD-10-CM

## 2018-04-12 DIAGNOSIS — E785 Hyperlipidemia, unspecified: Secondary | ICD-10-CM

## 2018-04-12 DIAGNOSIS — R252 Cramp and spasm: Secondary | ICD-10-CM

## 2018-04-12 DIAGNOSIS — R079 Chest pain, unspecified: Secondary | ICD-10-CM | POA: Diagnosis not present

## 2018-04-12 DIAGNOSIS — G4733 Obstructive sleep apnea (adult) (pediatric): Secondary | ICD-10-CM | POA: Diagnosis not present

## 2018-04-12 LAB — LIPID PANEL
CHOL/HDL RATIO: 4.8 ratio (ref 0.0–5.0)
Cholesterol, Total: 223 mg/dL — ABNORMAL HIGH (ref 100–199)
HDL: 46 mg/dL (ref >39)
LDL Calculated: 163 mg/dL — ABNORMAL HIGH (ref 0–99)
Triglycerides: 70 mg/dL (ref 0–149)
VLDL Cholesterol Cal: 14 mg/dL (ref 5–40)

## 2018-04-12 LAB — BASIC METABOLIC PANEL
BUN/Creatinine Ratio: 21 (ref 10–24)
BUN: 21 mg/dL (ref 8–27)
CO2: 21 mmol/L (ref 20–29)
Calcium: 9.5 mg/dL (ref 8.6–10.2)
Chloride: 101 mmol/L (ref 96–106)
Creatinine, Ser: 1.02 mg/dL (ref 0.76–1.27)
GFR calc Af Amer: 92 mL/min/{1.73_m2} (ref >59)
GFR calc non Af Amer: 80 mL/min/{1.73_m2} (ref >59)
Glucose: 92 mg/dL (ref 65–99)
POTASSIUM: 5 mmol/L (ref 3.5–5.2)
Sodium: 138 mmol/L (ref 134–144)

## 2018-04-12 LAB — MAGNESIUM: Magnesium: 2.3 mg/dL (ref 1.6–2.3)

## 2018-04-12 MED ORDER — METOPROLOL TARTRATE 25 MG PO TABS: 12.5000 mg | ORAL_TABLET | Freq: Once | ORAL | 0 refills | Status: DC

## 2018-04-12 NOTE — Progress Notes (Signed)
Cardiology Office Note   Date:  04/12/2018   ID:  Pedro Castaneda, DOB Jul 10, 1955, MRN [id number]  PCP:  Johny Blamer, MD  Cardiologist:  No primary care provider on file. EP: None  Chief Complaint  Patient presents with  . Follow-up    having chest pain for 2 weeks; chest pressure-feels ike horse sitting on chest      History of Present Illness: Pedro Castaneda is a 64 y.o. male with a PMH of moderately elevated calcium score on CT cardiac score, HLD, and OA who presents with complaints of chest pain.  Patient was last seen by cardiology, Tereso Newcomer, PA-C in 2017 for similar complaints. He had undergone a CT Cardiac score testing in 2016 which showed a calcium score of 23 placing him in the 49th percentile for age and sex matched control. He had an exercise tolerance test in 2016 as well which did not reveal any ST changes suggestive of ischemia. He has a distant family history of CAD with several male cousins dying from MI's in their 15s.   Pleasant gentleman, who presents today alone. Patient reports that for the past 2 weeks he has been experiencing substernal chest pressure/pain. He reports waking up chest pain free, however pressure gradually increases and remains constant throughout the day. He is quite active, exercising multiple days per week, working full time, and playing with his grandchildren. He reports worsening of chest pressure with strenuous activity and associated SOB at that time. He denies radiation of pain, diaphoresis, nausea, vomiting, dizziness, lightheadedness, or syncope. He reports that his symptoms are similar to those he experienced in 2016/2017 which eventually subsided without intervention until a couple weeks ago. His cholesterol is followed by his PCP and reports that his cholesterol has been above goal but he is hesitant to start medications and has been focusing on diet and exercise. He reports his blood pressures also have been a little elevated over  his last several outpatient visits but is unsure what they run from day to day as he does not have a blood pressure cuff at home; again mentions that he would like to avoid medications if possible. He reports some leg cramps recently. Denies orthopnea, PND, LE edema.   Past Medical History:  Diagnosis Date  . ED (erectile dysfunction)   . HLD (hyperlipidemia)    diet Rx  . Kidney stones 1996   kidney stones removal    Past Surgical History:  Procedure Laterality Date  . ELBOW SURGERY  1973  . GANGLION CYST EXCISION  1982   Rt Foot  . INGUINAL HERNIA REPAIR    . KNEE SURGERY  2011   R knee arthroscopic  . LAPAROSCOPIC APPENDECTOMY N/A 03/24/2016   Procedure: APPENDECTOMY LAPAROSCOPIC;  Surgeon: Luretha Murphy, MD;  Location: [facility];  Service: General;  Laterality: N/A;  . LITHOTRIPSY    . NEPHROSTOMY    . UMBILICAL HERNIA REPAIR       Current Outpatient Medications  Medication Sig Dispense Refill  . ALFALFA PO 1 capsule daily.    . Cyanocobalamin (VITAMIN B-12 PO) Take 1 tablet by mouth daily.    . famotidine (PEPCID) 20 MG tablet Take 20 mg by mouth daily as needed for heartburn or indigestion.    Marland Kitchen glucosamine-chondroitin 500-400 MG tablet Take 1,200 tablets by mouth 1 dose over 24 hours.      Marland Kitchen ibuprofen (ADVIL,MOTRIN) 200 MG tablet Take 800 mg by mouth every 6 (six) hours as needed for  moderate pain.    . Multiple Vitamin (MULTIVITAMIN) capsule Take 1 capsule by mouth daily.    . Red Yeast Rice Extract (RED YEAST RICE PO) Take 1 tablet by mouth daily.    . tadalafil (CIALIS) 5 MG tablet Take 5 mg by mouth daily as needed for erectile dysfunction.    . vitamin C (ASCORBIC ACID) 500 MG tablet Take 500 mg by mouth daily.      . metoprolol tartrate (LOPRESSOR) 25 MG tablet Take 0.5 tablets (12.5 mg total) by mouth once for 1 dose. for procedure. 1 tablet 0   No current facility-administered medications for this visit.     Allergies:   Patient has no known allergies.     Social History:  The patient  reports that he quit smoking about 43 years ago. His smoking use included cigarettes. He has a 0.25 pack-year smoking history. He has never used smokeless tobacco. He reports that he does not drink alcohol or use drugs.   Family History:  The patient's family history includes Atrial fibrillation in his father; Breast cancer in his mother; Heart attack in his cousin.    ROS:  Please see the history of present illness.   Otherwise, review of systems are positive for none.   All other systems are reviewed and negative.    PHYSICAL EXAM: VS:  BP (!) 142/86   Pulse 60   Ht 5\' 10"  (1.778 m)   Wt 212 lb (96.2 kg)   BMI 30.42 kg/m  , BMI Body mass index is 30.42 kg/m. GEN: Well nourished, well developed, in no acute distress HEENT: sclera anicteric Neck: no JVD, carotid bruits, or masses Cardiac: RRR; no murmurs, rubs, or gallops,no edema  Respiratory:  clear to auscultation bilaterally, normal work of breathing GI: soft, nontender, nondistended, + BS MS: no deformity or atrophy Skin: warm and dry, no rash Neuro:  Strength and sensation are intact Psych: pleasant, euthymic mood, full affect   EKG:  EKG is ordered today. The ekg ordered today demonstrates sinus rhythm with rate 60 bpm, isolated TWI in III (seen on previous), no STE/D   Recent Labs: No results found for requested labs within last 8760 hours.    Lipid Panel    Component Value Date/Time   CHOL 176 05/29/2015 0806   TRIG 74 05/29/2015 0806   HDL 37 (L) 05/29/2015 0806   CHOLHDL 4.8 05/29/2015 0806   VLDL 15 05/29/2015 0806   LDLCALC 124 05/29/2015 0806      Wt Readings from Last 3 Encounters:  04/12/18 212 lb (96.2 kg)  03/24/16 210 lb (95.3 kg)  05/30/15 203 lb 6.4 oz (92.3 kg)      Other studies Reviewed: Additional studies/ records that were reviewed today include: Marland Kitchen. Exercise treadmill test 03/2015: Response to Stress There was no ST segment deviation noted during  stress.  Arrhythmias during stress: occasional PVCs.  Arrhythmias during recovery: occasional PVCs.  Arrhythmias were not significant.  ECG was interpretable and conclusive.    CT cardiac scoring 03/2015: IMPRESSION: Coronary calcium score of 23. This was 49th percentile for age and sex matched control.   ASSESSMENT AND PLAN:  1. Chest pain: fairly persistent chest pressure which builds throughout the day and is exacerbated by activity. Some SOB with activity, otherwise no associated symptoms. Calcium score of 23 placing patient in the 49th percentile for age and sex. Risk factors for CAD include HLD and suspected HTN. Chest pain sounds atypical for cardiac pain. - Will check  a coronary CTA to evaluate coronary arteries  - Recommended to trial PPI for possible GI etiology - Will check a FLP - suspect he would benefit from being on a statin if persistently elevated as he has tried diet and exercise.  - Continue aspirin  2. HLD: Last lipid panel on file is from 2017 with LDL 124. He reports it has been boarderline elevated at PCP visit last year. He is resistant to starting statin therapy and has been focusing on dietary and lifestyle modifications.  - Will check a FLP - suspect he would benefit from being on a statin if persistently elevated as he has tried diet and exercise.   3. Elevated blood pressure: BP 142/86 today. He does not monitor BP routinely but states he felt it was similar at last PCP visit in 2019.  - Recommended he obtain a blood pressure cuff and keep a log of daily readings over the next 2 weeks. Patient will notify the office via MyChart with home readings to determine if he would benefit from antihypertensive therapy.   4. Leg cramps: reports struggling with leg cramps  - Will check electrolytes today  5. OSA: reports compliance with CPAP machine - Continue CPAP   Current medicines are reviewed at length with the patient today.  The patient does not have concerns  regarding medicines.  The following changes have been made:  No medication changes occurred at this visit  Labs/ tests ordered today include:   Orders Placed This Encounter  Procedures  . CT CORONARY MORPH W/CTA COR W/SCORE W/CA W/CM &/OR WO/CM  . CT CORONARY FRACTIONAL FLOW RESERVE DATA PREP  . CT CORONARY FRACTIONAL FLOW RESERVE FLUID ANALYSIS  . Basic metabolic panel  . Basic metabolic panel  . Lipid panel  . Magnesium  . EKG 12-Lead     Disposition:   FU with Dr. Allyson Sabal in 3 months if Coronary CTA is normal; sooner if abnormal to discuss possible cardiac catheterization  Signed, Beatriz Stallion, PA-C  04/12/2018 9:26 AM

## 2018-04-12 NOTE — Patient Instructions (Addendum)
Medication Instructions:  The current medical regimen is effective;  continue present plan and medications.  If you need a refill on your cardiac medications before your next appointment, please call your pharmacy.   Lab work: BMET, LIPID, MAG today. BMET 1 week before CT when scheduled. If you have labs (blood work) drawn today and your tests are completely normal, you will receive your results only by: Marland Kitchen MyChart Message (if you have MyChart) OR . A paper copy in the mail If you have any lab test that is abnormal or we need to change your treatment, we will call you to review the results.  Testing/Procedures: Your physician has requested that you have cardiac CT. Cardiac computed tomography (CT) is a painless test that uses an x-ray machine to take clear, detailed pictures of your heart. For further information please visit [email or web address] Please follow instruction sheet as given.   Follow-Up: At [facility], you and your health needs are our priority.  As part of our continuing mission to provide you with exceptional heart care, we have created designated Provider Care Teams.  These Care Teams include your primary Cardiologist (physician) and Advanced Practice Providers (APPs -  Physician Assistants and Nurse Practitioners) who all work together to provide you with the care you need, when you need it. You will need a follow up appointment in 3 months.  Please call our office 2 months in advance to schedule this appointment.  You may see Dr.Berry or one of the following Advanced Practice Providers on your designated Care Team:   Corine Shelter, PA-C Judy Pimple, [place] . Marjie Skiff, PA-C  Any Other Special Instructions Will Be Listed Below (If Applicable).     Please arrive at the [facility] main entrance of [facility]  (30-45 minutes prior to test start time)  [facility] [address] [place], [place] [address] [phone]  Proceed to the  [facility] Radiology Department (First Floor).  Please follow these instructions carefully (unless otherwise directed):  Hold all erectile dysfunction medications at least 48 hours prior to test.  On the Night Before the Test: . Be sure to Drink plenty of water. . Do not consume any caffeinated/decaffeinated beverages or chocolate 12 hours prior to your test. . Do not take any antihistamines 12 hours prior to your test. . If you take Metformin do not take 24 hours prior to test.  On the Day of the Test: . Drink plenty of water. Do not drink any water within one hour of the test. . Do not eat any food 4 hours prior to the test. . You may take your regular medications prior to the test.  . Take metoprolol (Lopressor) two hours prior to test. . HOLD Furosemide/Hydrochlorothiazide morning of the test.      After the Test: . Drink plenty of water. . After receiving IV contrast, you may experience a mild flushed feeling. This is normal. . On occasion, you may experience a mild rash up to 24 hours after the test. This is not dangerous. If this occurs, you can take Benadryl 25 mg and increase your fluid intake. . If you experience trouble breathing, this can be serious. If it is severe call 911 IMMEDIATELY. If it is mild, please call our office. . If you take any of these medications: Glipizide/Metformin, Avandament, Glucavance, please do not take 48 hours after completing test.

## 2018-04-19 ENCOUNTER — Telehealth: Payer: Self-pay | Admitting: Medical

## 2018-04-19 NOTE — Telephone Encounter (Signed)
New Message   Pt is returning phone call, please call back   He would like a direct line to call you back if he misses the phone call

## 2018-04-19 NOTE — Telephone Encounter (Signed)
Called patient, he would like a call back from [place] regarding his lab work.  He is in a funeral tomorrow from 12-2, but states before that would be okay to call to discuss the medication with him.   Please advise for tomorrow.  Thanks!

## 2018-04-22 NOTE — Telephone Encounter (Signed)
Discussed cholesterol results with patient 04/20/17. Patient expressed concerns regarding side effects with statin medications. He would like to try more aggressive dietary and lifestyle modifications for the next few months in an effort to lower his cholesterol without medications. He has a coronary CTA planned to evaluate intermittent chest pain. We discussed that if his CT scan suggested he had plaque buildup in his coronary arteries, we would strongly encourage statin medications for risk factor modifications. He was in agreement with the plan.  Beatriz Stallion, PA-C

## 2018-05-16 ENCOUNTER — Telehealth (HOSPITAL_COMMUNITY): Payer: Self-pay | Admitting: Emergency Medicine

## 2018-05-16 NOTE — Telephone Encounter (Signed)
Reaching out to patient to offer assistance regarding upcoming cardiac imaging study; pt verbalizes understanding of appt date/time, parking situation and where to check in, pre-test NPO status and medications ordered, and verified current allergies; name and call back number provided for further questions should they arise Otis Portal RN Navigator Cardiac Imaging [facility] [phone] office [phone] cell 

## 2018-05-18 ENCOUNTER — Encounter (HOSPITAL_COMMUNITY): Payer: Self-pay

## 2018-05-18 ENCOUNTER — Ambulatory Visit (HOSPITAL_COMMUNITY): Admission: RE | Admit: 2018-05-18 | Payer: BLUE CROSS/BLUE SHIELD | Source: Ambulatory Visit

## 2018-05-18 ENCOUNTER — Ambulatory Visit (HOSPITAL_COMMUNITY)
Admission: RE | Admit: 2018-05-18 | Discharge: 2018-05-18 | Disposition: A | Discharge status: Home / Self Care | Payer: BLUE CROSS/BLUE SHIELD | Source: Ambulatory Visit | Attending: Medical | Admitting: Medical

## 2018-05-18 DIAGNOSIS — Z006 Encounter for examination for normal comparison and control in clinical research program: Secondary | ICD-10-CM

## 2018-05-18 DIAGNOSIS — R079 Chest pain, unspecified: Secondary | ICD-10-CM | POA: Diagnosis not present

## 2018-05-18 MED ORDER — NITROGLYCERIN 0.4 MG SL SUBL
0.8000 mg | SUBLINGUAL_TABLET | Freq: Once | SUBLINGUAL | Status: AC
  Administered 2018-05-18: 0.8 mg via SUBLINGUAL
  Filled 2018-05-18: qty 25

## 2018-05-18 MED ORDER — IOPAMIDOL (ISOVUE-370) INJECTION 76%
80.0000 mL | Freq: Once | INTRAVENOUS | Status: AC | PRN
  Administered 2018-05-18: 80 mL via INTRAVENOUS

## 2018-05-18 MED ORDER — NITROGLYCERIN 0.4 MG SL SUBL
SUBLINGUAL_TABLET | SUBLINGUAL | Status: AC
  Filled 2018-05-18: qty 2

## 2018-05-18 NOTE — Research (Signed)
Cadfem Informed Consent    Patient Name: Pedro Castaneda   Subject met inclusion and exclusion criteria.  The informed consent form, study requirements and expectations were reviewed with the subject and questions and concerns were addressed prior to the signing of the consent form.  The subject verbalized understanding of the trail requirements.  The subject agreed to participate in the CADFEM trial and signed the informed consent.  The informed consent was obtained prior to performance of any protocol-specific procedures for the subject.  A copy of the signed informed consent was given to the subject and a copy was placed in the subject's medical record.   [name]

## 2018-05-18 NOTE — Progress Notes (Signed)
Patient tolerated CT without incident. Drank water and ate peanut crackers after. Ambulated to exit steady gait with wife in waiting room.

## 2018-05-24 DIAGNOSIS — H903 Sensorineural hearing loss, bilateral: Secondary | ICD-10-CM | POA: Diagnosis not present

## 2018-05-24 DIAGNOSIS — H9313 Tinnitus, bilateral: Secondary | ICD-10-CM | POA: Diagnosis not present

## 2018-05-25 ENCOUNTER — Other Ambulatory Visit: Payer: Self-pay

## 2018-05-25 ENCOUNTER — Telehealth: Payer: Self-pay

## 2018-05-25 DIAGNOSIS — E785 Hyperlipidemia, unspecified: Secondary | ICD-10-CM

## 2018-05-25 NOTE — Telephone Encounter (Signed)
Left voice message for the patient to call back for his CT results

## 2018-05-25 NOTE — Progress Notes (Signed)
Left voice message for the patient to call back for his CT results

## 2018-05-25 NOTE — Progress Notes (Signed)
Patient called back for results. Pedro Castaneda has been notified of the result and verbalized understanding.  All questions (if any) were answered. He was also in formed to return in 3 months to have lab work drawn.  Dorris Fetch, CMA 05/25/2018 1:37 PM

## 2018-05-25 NOTE — Telephone Encounter (Signed)
Patient called back for results. Mr. Pedro Castaneda has been notified of the result and verbalized understanding.  All questions (if any) were answered. He was also informed to return to the office in 3 months to have lab work drawn.

## 2018-07-05 DIAGNOSIS — G4733 Obstructive sleep apnea (adult) (pediatric): Secondary | ICD-10-CM | POA: Diagnosis not present

## 2018-08-24 DIAGNOSIS — E7800 Pure hypercholesterolemia, unspecified: Secondary | ICD-10-CM | POA: Diagnosis not present

## 2018-08-24 DIAGNOSIS — Z125 Encounter for screening for malignant neoplasm of prostate: Secondary | ICD-10-CM | POA: Diagnosis not present

## 2018-08-24 DIAGNOSIS — Z0000 Encounter for general adult medical examination without abnormal findings: Secondary | ICD-10-CM | POA: Diagnosis not present

## 2018-10-27 DIAGNOSIS — M545 Low back pain: Secondary | ICD-10-CM | POA: Diagnosis not present

## 2018-10-27 DIAGNOSIS — N209 Urinary calculus, unspecified: Secondary | ICD-10-CM | POA: Diagnosis not present

## 2018-11-15 DIAGNOSIS — M545 Low back pain: Secondary | ICD-10-CM | POA: Diagnosis not present

## 2018-11-15 DIAGNOSIS — M9904 Segmental and somatic dysfunction of sacral region: Secondary | ICD-10-CM | POA: Diagnosis not present

## 2018-11-15 DIAGNOSIS — M9905 Segmental and somatic dysfunction of pelvic region: Secondary | ICD-10-CM | POA: Diagnosis not present

## 2018-11-15 DIAGNOSIS — M9903 Segmental and somatic dysfunction of lumbar region: Secondary | ICD-10-CM | POA: Diagnosis not present

## 2018-11-17 DIAGNOSIS — M9903 Segmental and somatic dysfunction of lumbar region: Secondary | ICD-10-CM | POA: Diagnosis not present

## 2018-11-17 DIAGNOSIS — M9904 Segmental and somatic dysfunction of sacral region: Secondary | ICD-10-CM | POA: Diagnosis not present

## 2018-11-17 DIAGNOSIS — M25561 Pain in right knee: Secondary | ICD-10-CM | POA: Diagnosis not present

## 2018-11-17 DIAGNOSIS — M545 Low back pain: Secondary | ICD-10-CM | POA: Diagnosis not present

## 2019-02-15 DIAGNOSIS — M62838 Other muscle spasm: Secondary | ICD-10-CM | POA: Diagnosis not present

## 2019-02-15 DIAGNOSIS — M9902 Segmental and somatic dysfunction of thoracic region: Secondary | ICD-10-CM | POA: Diagnosis not present

## 2019-02-15 DIAGNOSIS — M546 Pain in thoracic spine: Secondary | ICD-10-CM | POA: Diagnosis not present

## 2019-02-23 ENCOUNTER — Ambulatory Visit (INDEPENDENT_AMBULATORY_CARE_PROVIDER_SITE_OTHER): Payer: BC Managed Care – PPO | Admitting: Physician Assistant

## 2019-02-23 ENCOUNTER — Encounter: Payer: Self-pay | Admitting: Physician Assistant

## 2019-02-23 ENCOUNTER — Ambulatory Visit: Payer: Self-pay

## 2019-02-23 DIAGNOSIS — M546 Pain in thoracic spine: Secondary | ICD-10-CM

## 2019-02-23 MED ORDER — METHYLPREDNISOLONE 4 MG PO TABS: ORAL_TABLET | ORAL | 0 refills | Status: DC

## 2019-02-23 NOTE — Progress Notes (Signed)
Office Visit Note   Patient: Pedro Castaneda           Date of Birth: 05/31/1955           MRN: [id number] Visit Date: 02/23/2019              Requested by: Shirline Frees, MD [address] [place],  [place] [address] PCP: Shirline Frees, MD   Assessment & Plan: Visit Diagnoses:  1. Pain in thoracic spine     Plan: We will place him on a Medrol Dosepak and he has muscle relaxants at home.  Also recommended moist heat to the upper thoracic spine.  We will follow up with [place] in 2 weeks to check his progress lack of.  Questions encouraged and answered at length.  Follow-Up Instructions: Return in about 2 weeks (around 03/09/2019).   Orders:  Orders Placed This Encounter  Procedures  . XR Thoracic Spine 2 View   Meds ordered this encounter  Medications  . methylPREDNISolone (MEDROL) 4 MG tablet    Sig: Take as directed    Dispense:  21 tablet    Refill:  0      Procedures: No procedures performed   Clinical Data: No additional findings.   Subjective: Chief Complaint  Patient presents with  . Spine - Pain    HPI Pedro Castaneda comes in today due to pain between both shoulder blades.  This been ongoing for some time.  He states he feels like a deep bruise.  Pain seems to be more to the left side.  No numbness no tingling.  He has had 4 visits with a chiropractor with no relief.  He denies any radicular symptoms down either arm.  He has had no neck pain.  Denies any particular injury.  He notes that he has had 12 pound weight loss over the past 3 months but has been trying to lose weight.  Pain does awaken him. Review of Systems Denies any fevers chills shortness of breath chest pain or cough.  Objective: Vital Signs: There were no vitals taken for this visit.  Physical Exam Constitutional:      Appearance: He is not ill-appearing or diaphoretic.  Pulmonary:     Effort: Pulmonary effort is normal.  Neurological:     Mental Status: He is alert and  oriented to person, place, and time.  Psychiatric:        Mood and Affect: Mood normal.        Behavior: Behavior normal.     Ortho Exam Upper extremities bilaterally 5 out of 5 strength strength throughout.  Full motor and sensation bilateral hands.  Radial pulses are 2+ and equal and symmetric.  Full range of motion cervical spine without pain.  Negative Spurling's bilaterally.  He has tenderness over the upper thoracic spine approximately T3-T4 with palpation and just lateral to the T3-4 spinal column on the left with palpation.  No rashes skin lesions ulcerations over the upper thoracic spine or cervical spine. Specialty Comments:  No specialty comments available.  Imaging: Xr Thoracic Spine 2 View  Result Date: 02/23/2019 AP and lateral T-spine: Overall alignment is well-maintained.  Did space well maintained.  No spondylolisthesis no acute fracture.    PMFS History: Patient Active Problem List   Diagnosis Date Noted  . Acute appendicitis 03/24/2016  . Chest pain, atypical 04/12/2015  . Solitary pulmonary nodule 04/11/2015   Past Medical History:  Diagnosis Date  . ED (erectile dysfunction)   .  HLD (hyperlipidemia)    diet Rx  . Kidney stones 1996   kidney stones removal    Family History  Problem Relation Age of Onset  . Breast cancer Mother   . Atrial fibrillation Father   . Heart attack Cousin     Past Surgical History:  Procedure Laterality Date  . ELBOW SURGERY  1973  . GANGLION CYST EXCISION  1982   Rt Foot  . INGUINAL HERNIA REPAIR    . KNEE SURGERY  2011   R knee arthroscopic  . LAPAROSCOPIC APPENDECTOMY N/A 03/24/2016   Procedure: APPENDECTOMY LAPAROSCOPIC;  Surgeon: Luretha Murphy, MD;  Location: [facility];  Service: General;  Laterality: N/A;  . LITHOTRIPSY    . NEPHROSTOMY    . UMBILICAL HERNIA REPAIR     Social History   Occupational History  . Occupation: Chief of Staff  . Occupation: Real Estate  Tobacco Use  . Smoking status: Former Smoker     Packs/day: 0.25    Years: 1.00    Pack years: 0.25    Types: Cigarettes    Quit date: 04/07/1975    Years since quitting: 43.9  . Smokeless tobacco: Never Used  Substance and Sexual Activity  . Alcohol use: No    Alcohol/week: 0.0 standard drinks  . Drug use: No  . Sexual activity: Not on file

## 2019-03-14 DIAGNOSIS — H02831 Dermatochalasis of right upper eyelid: Secondary | ICD-10-CM | POA: Diagnosis not present

## 2019-03-14 DIAGNOSIS — H524 Presbyopia: Secondary | ICD-10-CM | POA: Diagnosis not present

## 2019-03-14 DIAGNOSIS — H0288A Meibomian gland dysfunction right eye, upper and lower eyelids: Secondary | ICD-10-CM | POA: Diagnosis not present

## 2019-03-14 DIAGNOSIS — H02834 Dermatochalasis of left upper eyelid: Secondary | ICD-10-CM | POA: Diagnosis not present

## 2019-03-14 DIAGNOSIS — H0288B Meibomian gland dysfunction left eye, upper and lower eyelids: Secondary | ICD-10-CM | POA: Diagnosis not present

## 2019-05-22 DIAGNOSIS — L820 Inflamed seborrheic keratosis: Secondary | ICD-10-CM | POA: Diagnosis not present

## 2019-05-22 DIAGNOSIS — L218 Other seborrheic dermatitis: Secondary | ICD-10-CM | POA: Diagnosis not present

## 2019-05-22 DIAGNOSIS — D1801 Hemangioma of skin and subcutaneous tissue: Secondary | ICD-10-CM | POA: Diagnosis not present

## 2019-05-28 ENCOUNTER — Other Ambulatory Visit: Payer: Self-pay

## 2019-05-28 ENCOUNTER — Ambulatory Visit: Payer: BC Managed Care – PPO | Attending: Internal Medicine

## 2019-05-28 DIAGNOSIS — Z23 Encounter for immunization: Secondary | ICD-10-CM

## 2019-05-28 NOTE — Progress Notes (Signed)
   Covid-19 Vaccination Clinic  Name:  Pedro Castaneda    MRN: [id number] DOB: Jun 13, 1955  05/28/2019  [name] was observed post Covid-19 immunization for 15 minutes without incidence. He was provided with Vaccine Information Sheet and instruction to access the V-Safe system.   [name] was instructed to call 911 with any severe reactions post vaccine: Marland Kitchen Difficulty breathing  . Swelling of your face and throat  . A fast heartbeat  . A bad rash all over your body  . Dizziness and weakness    Immunizations Administered    Name Date Dose VIS Date Route   Pfizer COVID-19 Vaccine 05/28/2019  4:06 PM 0.3 mL 03/17/2019 Intramuscular   Manufacturer: [employer], [employer]   Lot: [id number]   NDC: [address]-8

## 2019-06-20 ENCOUNTER — Ambulatory Visit: Payer: BC Managed Care – PPO | Attending: Internal Medicine

## 2019-06-20 DIAGNOSIS — Z23 Encounter for immunization: Secondary | ICD-10-CM

## 2019-06-20 NOTE — Progress Notes (Signed)
   Covid-19 Vaccination Clinic  Name:  Pedro Castaneda    MRN: [id number] DOB: October 18, 1955  06/20/2019  [name] was observed post Covid-19 immunization for 15 minutes without incident. He was provided with Vaccine Information Sheet and instruction to access the V-Safe system.   [name] was instructed to call 911 with any severe reactions post vaccine: Marland Kitchen Difficulty breathing  . Swelling of face and throat  . A fast heartbeat  . A bad rash all over body  . Dizziness and weakness   Immunizations Administered    Name Date Dose VIS Date Route   Pfizer COVID-19 Vaccine 06/20/2019 10:48 AM 0.3 mL 03/17/2019 Intramuscular   Manufacturer: [employer], [employer]   Lot: [id number]   NDC: [address]-0

## 2019-06-27 DIAGNOSIS — G4733 Obstructive sleep apnea (adult) (pediatric): Secondary | ICD-10-CM | POA: Diagnosis not present

## 2019-08-10 ENCOUNTER — Telehealth: Payer: Self-pay

## 2019-08-10 NOTE — Telephone Encounter (Signed)
Error

## 2019-09-12 DIAGNOSIS — E7800 Pure hypercholesterolemia, unspecified: Secondary | ICD-10-CM | POA: Diagnosis not present

## 2019-09-12 DIAGNOSIS — Z125 Encounter for screening for malignant neoplasm of prostate: Secondary | ICD-10-CM | POA: Diagnosis not present

## 2019-09-12 DIAGNOSIS — Z0000 Encounter for general adult medical examination without abnormal findings: Secondary | ICD-10-CM | POA: Diagnosis not present

## 2019-09-12 DIAGNOSIS — Z1211 Encounter for screening for malignant neoplasm of colon: Secondary | ICD-10-CM | POA: Diagnosis not present

## 2019-09-12 DIAGNOSIS — G4733 Obstructive sleep apnea (adult) (pediatric): Secondary | ICD-10-CM | POA: Diagnosis not present

## 2020-01-12 DIAGNOSIS — R197 Diarrhea, unspecified: Secondary | ICD-10-CM | POA: Diagnosis not present

## 2020-01-12 DIAGNOSIS — Z20822 Contact with and (suspected) exposure to covid-19: Secondary | ICD-10-CM | POA: Diagnosis not present

## 2020-01-22 DIAGNOSIS — Z1159 Encounter for screening for other viral diseases: Secondary | ICD-10-CM | POA: Diagnosis not present

## 2020-01-24 DIAGNOSIS — Z1211 Encounter for screening for malignant neoplasm of colon: Secondary | ICD-10-CM | POA: Diagnosis not present

## 2020-01-24 DIAGNOSIS — D122 Benign neoplasm of ascending colon: Secondary | ICD-10-CM | POA: Diagnosis not present

## 2020-01-24 DIAGNOSIS — K5730 Diverticulosis of large intestine without perforation or abscess without bleeding: Secondary | ICD-10-CM | POA: Diagnosis not present

## 2020-01-24 DIAGNOSIS — D123 Benign neoplasm of transverse colon: Secondary | ICD-10-CM | POA: Diagnosis not present

## 2020-01-24 DIAGNOSIS — D124 Benign neoplasm of descending colon: Secondary | ICD-10-CM | POA: Diagnosis not present

## 2020-01-24 DIAGNOSIS — D120 Benign neoplasm of cecum: Secondary | ICD-10-CM | POA: Diagnosis not present

## 2020-03-06 ENCOUNTER — Other Ambulatory Visit: Payer: Self-pay

## 2020-03-06 ENCOUNTER — Ambulatory Visit (INDEPENDENT_AMBULATORY_CARE_PROVIDER_SITE_OTHER): Payer: BC Managed Care – PPO | Admitting: Orthopedic Surgery

## 2020-03-06 ENCOUNTER — Ambulatory Visit: Payer: Self-pay

## 2020-03-06 ENCOUNTER — Telehealth: Payer: Self-pay

## 2020-03-06 ENCOUNTER — Encounter: Payer: Self-pay | Admitting: Orthopedic Surgery

## 2020-03-06 DIAGNOSIS — M25522 Pain in left elbow: Secondary | ICD-10-CM

## 2020-03-06 DIAGNOSIS — M25561 Pain in right knee: Secondary | ICD-10-CM

## 2020-03-06 NOTE — Progress Notes (Signed)
Office Visit Note   Patient: Pedro Castaneda           Date of Birth: 1955/10/02           MRN: [id number] Visit Date: 03/06/2020 Requested by: Johny Blamer, MD [phone] [address] [place],  [place] [address] PCP: Johny Blamer, MD  Subjective: Chief Complaint  Patient presents with  . Left Elbow - Pain  . Right Knee - Pain    HPI: Pedro Castaneda is a 64 year old active patient with left elbow pain and right knee pain.  He has known history of right knee arthritis.  He likes to play basketball with the 30 and 40 year olds.  He is able to "hang with them".  He would like to continue to play basketball but it is becoming untenable with his right knee.  Did have some mechanical symptoms yesterday.  He uses a brace when he plays basketball and takes ibuprofen but that starting to take a toll on his GI system.  States that he has been gaining weight because he has not been able to be as active.  He does enjoy jogging and would like to do 1 more 5K but is not sure he can do it because of his knee.              ROS: All systems reviewed are negative as they relate to the chief complaint within the history of present illness.  Patient denies  fevers or chills.   Assessment & Plan: Visit Diagnoses:  1. Pain in left elbow   2. Right knee pain, unspecified chronicity     Plan: Impression is right knee pain.  Heading for total knee replacement.  Cannot really run or play basketball with a total knee replacement.  We talked about cortisone and gel injections.  He is not really planning on knee replacement anytime in the future so I think gel injections indicated followed by cortisone.  We can get him on an alternating 60-month schedule.  Regarding the left elbow this is classic tennis elbow.  Recommend stationary bike as well as eccentric strengthening exercises and counterforce bracing.  I think we could do PRP injection as a last resort but he wants to try these other measures first.  Would hold  off on cortisone injection for now.  Follow-up as needed in January for right knee injection.  This patient is diagnosed with osteoarthritis of the knee(s).    Radiographs show evidence of joint space narrowing, osteophytes, subchondral sclerosis and/or subchondral cysts.  This patient has knee pain which interferes with functional and activities of daily living.    This patient has experienced inadequate response, adverse effects and/or intolerance with conservative treatments such as acetaminophen, NSAIDS, topical creams, physical therapy or regular exercise, knee bracing and/or weight loss.   This patient has experienced inadequate response or has a contraindication to intra articular steroid injections for at least 3 months.   This patient is not scheduled to have a total knee replacement within 6 months of starting treatment with viscosupplementation.   Follow-Up Instructions: Return if symptoms worsen or fail to improve.   Orders:  Orders Placed This Encounter  Procedures  . XR KNEE 3 VIEW RIGHT  . XR Elbow 2 Views Left   No orders of the defined types were placed in this encounter.     Procedures: No procedures performed   Clinical Data: No additional findings.  Objective: Vital Signs: There were no vitals taken for this visit.  Physical Exam:   Constitutional: Patient appears well-developed HEENT:  Head: Normocephalic Eyes:EOM are normal Neck: Normal range of motion Cardiovascular: Normal rate Pulmonary/chest: Effort normal Neurologic: Patient is alert Skin: Skin is warm Psychiatric: Patient has normal mood and affect    Ortho Exam: Ortho exam demonstrates full range of motion of left elbow.  Pronation supination flexion extension is full.  Does have tenderness lateral epicondyle with resisted wrist extension.  No subluxation of the ulnar nerve.  EPL FPL interosseous function intact.  Grip strength diminished left versus right.  No tenderness radial tunnel in  the supinator region.  Right knee exam demonstrates about a 10 degree flexion contracture right versus 0 on the left.  No effusion.  Collateral crucial ligaments are stable.  Extensor mechanism is intact.  Varus alignment present.  Pedal pulse palpable.  Specialty Comments:  No specialty comments available.  Imaging: XR Elbow 2 Views Left  Result Date: 03/06/2020 AP lateral left elbow reviewed.  No acute fracture.  No arthritis.  No ossicles off the medial or lateral epicondyle  XR KNEE 3 VIEW RIGHT  Result Date: 03/06/2020 AP lateral merchant right knee reviewed.  Tricompartmental osteoarthritis is present worse in the medial compartment.  Spurring present in all 3 compartments.  No acute fracture or dislocation.    PMFS History: Patient Active Problem List   Diagnosis Date Noted  . Acute appendicitis 03/24/2016  . Chest pain, atypical 04/12/2015  . Solitary pulmonary nodule 04/11/2015   Past Medical History:  Diagnosis Date  . ED (erectile dysfunction)   . HLD (hyperlipidemia)    diet Rx  . Kidney stones 1996   kidney stones removal    Family History  Problem Relation Age of Onset  . Breast cancer Mother   . Atrial fibrillation Father   . Heart attack Cousin     Past Surgical History:  Procedure Laterality Date  . ELBOW SURGERY  1973  . GANGLION CYST EXCISION  1982   Rt Foot  . INGUINAL HERNIA REPAIR    . KNEE SURGERY  2011   R knee arthroscopic  . LAPAROSCOPIC APPENDECTOMY N/A 03/24/2016   Procedure: APPENDECTOMY LAPAROSCOPIC;  Surgeon: Luretha Murphy, MD;  Location: [facility];  Service: General;  Laterality: N/A;  . LITHOTRIPSY    . NEPHROSTOMY    . UMBILICAL HERNIA REPAIR     Social History   Occupational History  . Occupation: Chief of Staff  . Occupation: Real Estate  Tobacco Use  . Smoking status: Former Smoker    Packs/day: 0.25    Years: 1.00    Pack years: 0.25    Types: Cigarettes    Quit date: 04/07/1975    Years since quitting: 44.9  . Smokeless  tobacco: Never Used  Substance and Sexual Activity  . Alcohol use: No    Alcohol/week: 0.0 standard drinks  . Drug use: No  . Sexual activity: Not on file

## 2020-03-06 NOTE — Telephone Encounter (Signed)
Noted  

## 2020-03-06 NOTE — Telephone Encounter (Signed)
Auth for right knee gel injection needed to be done in January.

## 2020-03-14 DIAGNOSIS — H52223 Regular astigmatism, bilateral: Secondary | ICD-10-CM | POA: Diagnosis not present

## 2020-03-14 DIAGNOSIS — H524 Presbyopia: Secondary | ICD-10-CM | POA: Diagnosis not present

## 2020-03-14 DIAGNOSIS — H5213 Myopia, bilateral: Secondary | ICD-10-CM | POA: Diagnosis not present

## 2020-04-12 ENCOUNTER — Telehealth: Payer: Self-pay

## 2020-04-12 NOTE — Telephone Encounter (Signed)
Submitted VOB, SynviscOne, right knee. 

## 2020-04-26 ENCOUNTER — Telehealth: Payer: Self-pay

## 2020-04-26 NOTE — Telephone Encounter (Signed)
Currently waiting for VOB.

## 2020-05-24 ENCOUNTER — Telehealth: Payer: Self-pay

## 2020-05-24 NOTE — Telephone Encounter (Signed)
PA required for SynviscOne, right knee. Faxed completed PA form to [employer] at [phone]. 

## 2020-05-29 ENCOUNTER — Telehealth: Payer: Self-pay

## 2020-05-29 NOTE — Telephone Encounter (Signed)
Called and left a VM advising patient to call back to schedule an appointment with Dr. August Saucer for gel injection.  Approved for SynviscOne, right knee. Buy & Bill Must meet deductible first Patient will be responsible for 50% OOP. No Co-pay PA Approval# [id number] Valid 05/24/2020- 11/20/2020

## 2020-06-25 DIAGNOSIS — G4733 Obstructive sleep apnea (adult) (pediatric): Secondary | ICD-10-CM | POA: Diagnosis not present

## 2020-09-05 DIAGNOSIS — Z0000 Encounter for general adult medical examination without abnormal findings: Secondary | ICD-10-CM | POA: Diagnosis not present

## 2020-09-05 DIAGNOSIS — N5203 Combined arterial insufficiency and corporo-venous occlusive erectile dysfunction: Secondary | ICD-10-CM | POA: Diagnosis not present

## 2020-09-05 DIAGNOSIS — M1A0720 Idiopathic chronic gout, left ankle and foot, without tophus (tophi): Secondary | ICD-10-CM | POA: Diagnosis not present

## 2020-09-05 DIAGNOSIS — E7800 Pure hypercholesterolemia, unspecified: Secondary | ICD-10-CM | POA: Diagnosis not present

## 2020-09-05 DIAGNOSIS — G4733 Obstructive sleep apnea (adult) (pediatric): Secondary | ICD-10-CM | POA: Diagnosis not present

## 2020-09-05 DIAGNOSIS — Z125 Encounter for screening for malignant neoplasm of prostate: Secondary | ICD-10-CM | POA: Diagnosis not present

## 2020-09-10 DIAGNOSIS — M1812 Unilateral primary osteoarthritis of first carpometacarpal joint, left hand: Secondary | ICD-10-CM | POA: Diagnosis not present

## 2020-09-10 DIAGNOSIS — S8992XA Unspecified injury of left lower leg, initial encounter: Secondary | ICD-10-CM | POA: Diagnosis not present

## 2020-09-10 DIAGNOSIS — M19072 Primary osteoarthritis, left ankle and foot: Secondary | ICD-10-CM | POA: Diagnosis not present

## 2020-09-19 ENCOUNTER — Ambulatory Visit (INDEPENDENT_AMBULATORY_CARE_PROVIDER_SITE_OTHER): Payer: BC Managed Care – PPO | Admitting: Orthopedic Surgery

## 2020-09-19 ENCOUNTER — Encounter: Payer: Self-pay | Admitting: Orthopedic Surgery

## 2020-09-19 ENCOUNTER — Other Ambulatory Visit: Payer: Self-pay

## 2020-09-19 DIAGNOSIS — M1711 Unilateral primary osteoarthritis, right knee: Secondary | ICD-10-CM | POA: Diagnosis not present

## 2020-09-19 MED ORDER — LIDOCAINE HCL 1 % IJ SOLN
5.0000 mL | INTRAMUSCULAR | Status: AC | PRN
  Administered 2020-09-19: 5 mL

## 2020-09-19 MED ORDER — HYLAN G-F 20 48 MG/6ML IX SOSY
48.0000 mg | PREFILLED_SYRINGE | INTRA_ARTICULAR | Status: AC | PRN
  Administered 2020-09-19: 48 mg via INTRA_ARTICULAR

## 2020-09-19 NOTE — Progress Notes (Signed)
   Procedure Note  Patient: Pedro Castaneda             Date of Birth: 06-Jan-1956           MRN: [id number]             Visit Date: 09/19/2020  Procedures: Visit Diagnoses:  1. Arthritis of right knee     Large Joint Inj: R knee on 09/19/2020 4:45 PM Indications: pain, joint swelling and diagnostic evaluation Details: 18 G 1.5 in needle, superolateral approach  Arthrogram: No  Medications: 5 mL lidocaine 1 %; 48 mg Hylan 48 MG/6ML Outcome: tolerated well, no immediate complications Procedure, treatment alternatives, risks and benefits explained, specific risks discussed. Consent was given by the patient. Immediately prior to procedure a time out was called to verify the correct patient, procedure, equipment, support staff and site/side marked as required. Patient was prepped and draped in the usual sterile fashion.   [name] is having some persistent right knee pain.  On exam he is got about a 5 degree flexion contracture but no effusion.  Gel injection performed today.  He will call for cortisone versus gel injection within the next 6 months.  Continue with rehabilitation exercises.  None loadbearing quad strengthening.

## 2021-01-01 DIAGNOSIS — R3912 Poor urinary stream: Secondary | ICD-10-CM | POA: Diagnosis not present

## 2021-01-01 DIAGNOSIS — R3915 Urgency of urination: Secondary | ICD-10-CM | POA: Diagnosis not present

## 2021-01-01 DIAGNOSIS — R350 Frequency of micturition: Secondary | ICD-10-CM | POA: Diagnosis not present

## 2021-01-01 DIAGNOSIS — N401 Enlarged prostate with lower urinary tract symptoms: Secondary | ICD-10-CM | POA: Diagnosis not present

## 2021-01-01 DIAGNOSIS — R351 Nocturia: Secondary | ICD-10-CM | POA: Diagnosis not present

## 2021-05-15 ENCOUNTER — Telehealth: Payer: Self-pay

## 2021-05-15 NOTE — Telephone Encounter (Signed)
Auth needed for gel injection  

## 2021-05-15 NOTE — Telephone Encounter (Signed)
Noted  

## 2021-05-19 ENCOUNTER — Telehealth: Payer: Self-pay

## 2021-05-19 NOTE — Telephone Encounter (Signed)
Error

## 2021-06-11 ENCOUNTER — Encounter: Payer: Self-pay | Admitting: Orthopedic Surgery

## 2021-06-11 NOTE — Telephone Encounter (Signed)
Talked with patient concerning gel injection.  Appointment has been scheduled. 

## 2021-06-19 ENCOUNTER — Other Ambulatory Visit: Payer: Self-pay

## 2021-06-19 ENCOUNTER — Ambulatory Visit (INDEPENDENT_AMBULATORY_CARE_PROVIDER_SITE_OTHER): Payer: BC Managed Care – PPO | Admitting: Orthopedic Surgery

## 2021-06-19 DIAGNOSIS — M1711 Unilateral primary osteoarthritis, right knee: Secondary | ICD-10-CM

## 2021-06-20 ENCOUNTER — Encounter: Payer: Self-pay | Admitting: Orthopedic Surgery

## 2021-06-20 MED ORDER — HYLAN G-F 20 48 MG/6ML IX SOSY
48.0000 mg | PREFILLED_SYRINGE | INTRA_ARTICULAR | Status: AC | PRN
  Administered 2021-06-19: 48 mg via INTRA_ARTICULAR

## 2021-06-20 MED ORDER — LIDOCAINE HCL 1 % IJ SOLN
5.0000 mL | INTRAMUSCULAR | Status: AC | PRN
  Administered 2021-06-19: 5 mL

## 2021-06-20 NOTE — Progress Notes (Signed)
? ?  Procedure Note ? ?Patient: Pedro Castaneda             ?Date of Birth: 04-12-1955           ?MRN: [id number]             ?Visit Date: 06/19/2021 ? ?Procedures: ?Visit Diagnoses:  ?1. Arthritis of right knee   ? ? ?Large Joint Inj: R knee on 06/19/2021 3:50 PM ?Indications: pain, joint swelling and diagnostic evaluation ?Details: 18 G 1.5 in needle, superolateral approach ? ?Arthrogram: No ? ?Medications: 5 mL lidocaine 1 %; 48 mg Hylan 48 MG/6ML ?Outcome: tolerated well, no immediate complications ?Procedure, treatment alternatives, risks and benefits explained, specific risks discussed. Consent was given by the patient. Immediately prior to procedure a time out was called to verify the correct patient, procedure, equipment, support staff and site/side marked as required. Patient was prepped and draped in the usual sterile fashion.  ? ? ? ? ? ?

## 2021-06-25 ENCOUNTER — Ambulatory Visit: Payer: Self-pay | Admitting: Orthopedic Surgery

## 2021-07-03 DIAGNOSIS — G4733 Obstructive sleep apnea (adult) (pediatric): Secondary | ICD-10-CM | POA: Diagnosis not present

## 2021-07-22 ENCOUNTER — Telehealth: Payer: Self-pay

## 2021-07-22 NOTE — Telephone Encounter (Signed)
Called and left a Vm for patient to return my call concerning gel injection. ?

## 2021-09-11 DIAGNOSIS — Z125 Encounter for screening for malignant neoplasm of prostate: Secondary | ICD-10-CM | POA: Diagnosis not present

## 2021-09-11 DIAGNOSIS — Z0000 Encounter for general adult medical examination without abnormal findings: Secondary | ICD-10-CM | POA: Diagnosis not present

## 2021-09-11 DIAGNOSIS — E7800 Pure hypercholesterolemia, unspecified: Secondary | ICD-10-CM | POA: Diagnosis not present

## 2021-09-11 DIAGNOSIS — M1A0720 Idiopathic chronic gout, left ankle and foot, without tophus (tophi): Secondary | ICD-10-CM | POA: Diagnosis not present

## 2021-09-24 ENCOUNTER — Ambulatory Visit (INDEPENDENT_AMBULATORY_CARE_PROVIDER_SITE_OTHER): Payer: BC Managed Care – PPO | Admitting: Orthopedic Surgery

## 2021-09-24 ENCOUNTER — Ambulatory Visit (INDEPENDENT_AMBULATORY_CARE_PROVIDER_SITE_OTHER): Payer: BC Managed Care – PPO

## 2021-09-24 DIAGNOSIS — G8929 Other chronic pain: Secondary | ICD-10-CM

## 2021-09-24 DIAGNOSIS — M25561 Pain in right knee: Secondary | ICD-10-CM | POA: Diagnosis not present

## 2021-09-26 ENCOUNTER — Encounter: Payer: Self-pay | Admitting: Orthopedic Surgery

## 2021-10-08 ENCOUNTER — Ambulatory Visit (INDEPENDENT_AMBULATORY_CARE_PROVIDER_SITE_OTHER): Payer: BC Managed Care – PPO | Admitting: Orthopedic Surgery

## 2021-10-08 DIAGNOSIS — M1711 Unilateral primary osteoarthritis, right knee: Secondary | ICD-10-CM

## 2021-10-12 ENCOUNTER — Encounter: Payer: Self-pay | Admitting: Orthopedic Surgery

## 2021-10-12 MED ORDER — BUPIVACAINE HCL 0.25 % IJ SOLN
4.0000 mL | INTRAMUSCULAR | Status: AC | PRN
  Administered 2021-10-08: 4 mL via INTRA_ARTICULAR

## 2021-10-12 MED ORDER — LIDOCAINE HCL 1 % IJ SOLN
5.0000 mL | INTRAMUSCULAR | Status: AC | PRN
  Administered 2021-10-08: 5 mL

## 2021-10-12 MED ORDER — METHYLPREDNISOLONE ACETATE 40 MG/ML IJ SUSP
40.0000 mg | INTRAMUSCULAR | Status: AC | PRN
  Administered 2021-10-08: 40 mg via INTRA_ARTICULAR

## 2021-10-12 NOTE — Progress Notes (Signed)
   Procedure Note  Patient: Pedro Castaneda             Date of Birth: 1955/10/29           MRN: [id number]             Visit Date: 10/08/2021  Procedures: Visit Diagnoses:  1. Arthritis of right knee     Large Joint Inj: R knee on 10/08/2021 9:31 AM Indications: diagnostic evaluation, joint swelling and pain Details: 18 G 1.5 in needle, superolateral approach  Arthrogram: No  Medications: 5 mL lidocaine 1 %; 40 mg methylPREDNISolone acetate 40 MG/ML; 4 mL bupivacaine 0.25 % Outcome: tolerated well, no immediate complications Procedure, treatment alternatives, risks and benefits explained, specific risks discussed. Consent was given by the patient. Immediately prior to procedure a time out was called to verify the correct patient, procedure, equipment, support staff and site/side marked as required. Patient was prepped and draped in the usual sterile fashion.

## 2021-12-15 ENCOUNTER — Encounter: Payer: Self-pay | Admitting: Orthopedic Surgery

## 2021-12-22 ENCOUNTER — Ambulatory Visit (INDEPENDENT_AMBULATORY_CARE_PROVIDER_SITE_OTHER): Payer: BC Managed Care – PPO | Admitting: Orthopedic Surgery

## 2021-12-22 DIAGNOSIS — M1711 Unilateral primary osteoarthritis, right knee: Secondary | ICD-10-CM | POA: Diagnosis not present

## 2021-12-23 ENCOUNTER — Encounter: Payer: Self-pay | Admitting: Orthopedic Surgery

## 2021-12-24 ENCOUNTER — Encounter: Payer: Self-pay | Admitting: Orthopedic Surgery

## 2021-12-24 MED ORDER — BUPIVACAINE HCL 0.25 % IJ SOLN
4.0000 mL | INTRAMUSCULAR | Status: AC | PRN
  Administered 2021-12-22: 4 mL via INTRA_ARTICULAR

## 2021-12-24 MED ORDER — LIDOCAINE HCL 1 % IJ SOLN
5.0000 mL | INTRAMUSCULAR | Status: AC | PRN
  Administered 2021-12-22: 5 mL

## 2021-12-24 MED ORDER — METHYLPREDNISOLONE ACETATE 40 MG/ML IJ SUSP
40.0000 mg | INTRAMUSCULAR | Status: AC | PRN
  Administered 2021-12-22: 40 mg via INTRA_ARTICULAR

## 2021-12-24 NOTE — Progress Notes (Signed)
   Procedure Note  Patient: Pedro Castaneda             Date of Birth: April 27, 1955           MRN: [id number]             Visit Date: 12/22/2021  Procedures: Visit Diagnoses:  1. Arthritis of right knee     Large Joint Inj on 12/22/2021 6:44 PM Indications: diagnostic evaluation, joint swelling and pain Details: 18 G 1.5 in needle, superolateral approach  Arthrogram: No  Medications: 5 mL lidocaine 1 %; 40 mg methylPREDNISolone acetate 40 MG/ML; 4 mL bupivacaine 0.25 % Outcome: tolerated well, no immediate complications Procedure, treatment alternatives, risks and benefits explained, specific risks discussed. Consent was given by the patient. Immediately prior to procedure a time out was called to verify the correct patient, procedure, equipment, support staff and site/side marked as required. Patient was prepped and draped in the usual sterile fashion.    Pedro Castaneda has lost weight going on a low sugar diet.  Knees are currently feeling reasonable.  Going to a trip to [place] in the near future and as a result the right knee is injected today.  No effusion is present.

## 2022-01-07 ENCOUNTER — Other Ambulatory Visit (HOSPITAL_COMMUNITY): Payer: BC Managed Care – PPO

## 2022-01-15 ENCOUNTER — Ambulatory Visit: Admit: 2022-01-15 | Payer: BC Managed Care – PPO | Admitting: Orthopedic Surgery

## 2022-01-15 DIAGNOSIS — Z01818 Encounter for other preprocedural examination: Secondary | ICD-10-CM

## 2022-01-15 SURGERY — ARTHROPLASTY, KNEE, TOTAL
Anesthesia: Spinal | Site: Knee | Laterality: Right

## 2022-01-19 DIAGNOSIS — N5201 Erectile dysfunction due to arterial insufficiency: Secondary | ICD-10-CM | POA: Diagnosis not present

## 2022-01-19 DIAGNOSIS — N401 Enlarged prostate with lower urinary tract symptoms: Secondary | ICD-10-CM | POA: Diagnosis not present

## 2022-01-19 DIAGNOSIS — R350 Frequency of micturition: Secondary | ICD-10-CM | POA: Diagnosis not present

## 2022-01-29 ENCOUNTER — Encounter: Payer: BC Managed Care – PPO | Admitting: Orthopedic Surgery

## 2022-02-16 DIAGNOSIS — E7800 Pure hypercholesterolemia, unspecified: Secondary | ICD-10-CM | POA: Diagnosis not present

## 2022-02-16 DIAGNOSIS — M1A0720 Idiopathic chronic gout, left ankle and foot, without tophus (tophi): Secondary | ICD-10-CM | POA: Diagnosis not present

## 2022-02-17 DIAGNOSIS — H16423 Pannus (corneal), bilateral: Secondary | ICD-10-CM | POA: Diagnosis not present

## 2022-02-17 DIAGNOSIS — H04123 Dry eye syndrome of bilateral lacrimal glands: Secondary | ICD-10-CM | POA: Diagnosis not present

## 2022-02-17 DIAGNOSIS — H0288A Meibomian gland dysfunction right eye, upper and lower eyelids: Secondary | ICD-10-CM | POA: Diagnosis not present

## 2022-02-17 DIAGNOSIS — H2513 Age-related nuclear cataract, bilateral: Secondary | ICD-10-CM | POA: Diagnosis not present

## 2022-03-18 DIAGNOSIS — L821 Other seborrheic keratosis: Secondary | ICD-10-CM | POA: Diagnosis not present

## 2022-03-18 DIAGNOSIS — L820 Inflamed seborrheic keratosis: Secondary | ICD-10-CM | POA: Diagnosis not present

## 2022-03-18 DIAGNOSIS — L570 Actinic keratosis: Secondary | ICD-10-CM | POA: Diagnosis not present

## 2022-07-16 DIAGNOSIS — G4733 Obstructive sleep apnea (adult) (pediatric): Secondary | ICD-10-CM | POA: Diagnosis not present

## 2022-10-14 ENCOUNTER — Other Ambulatory Visit: Payer: Self-pay | Admitting: Urology

## 2022-11-25 ENCOUNTER — Encounter: Payer: Self-pay | Admitting: Orthopedic Surgery

## 2022-11-25 ENCOUNTER — Ambulatory Visit (INDEPENDENT_AMBULATORY_CARE_PROVIDER_SITE_OTHER): Payer: BC Managed Care – PPO | Admitting: Orthopedic Surgery

## 2022-11-25 DIAGNOSIS — M170 Bilateral primary osteoarthritis of knee: Secondary | ICD-10-CM

## 2022-11-25 NOTE — Progress Notes (Signed)
Office Visit Note   Patient: Pedro Castaneda           Date of Birth: 25-Feb-1956           MRN: [id number] Visit Date: 11/25/2022 Requested by: Noberto Retort, MD [phone] [address] [place],  [place] [address] PCP: Noberto Retort, MD  Subjective: Chief Complaint  Patient presents with   Right Knee - Pain    HPI: Pedro Castaneda is a 67 y.o. male who presents to the office reporting bilateral knee pain right worse than left.  Had a hyperflexion injury to the right knee on Sunday when he was going down a slide.  Does have severe end-stage right knee arthritis but has been managing well with weight loss and diet modifications.  Does report difficulty with weightbearing since this injury.  States that "I can hardly walk".  Also reports both knees clicking..                ROS: All systems reviewed are negative as they relate to the chief complaint within the history of present illness.  Patient denies fevers or chills.  Assessment & Plan: Visit Diagnoses:  1. Synovitis of right hand     Plan: Impression is right knee moderate effusion left knee trace effusion with likely exacerbation of arthritis in both knees.  Both knees are aspirated and injected today.  Hopefully that can get him back to his baseline amount of pain prior to this hyperflexion injury.  Follow-Up Instructions: No follow-ups on file.   Orders:  No orders of the defined types were placed in this encounter.  No orders of the defined types were placed in this encounter.     Procedures: Large Joint Inj: bilateral knee on 11/25/2022 12:36 PM Indications: diagnostic evaluation, joint swelling and pain Details: 18 G 1.5 in needle, superolateral approach  Arthrogram: No  Outcome: tolerated well, no immediate complications Procedure, treatment alternatives, risks and benefits explained, specific risks discussed. Consent was given by the patient. Immediately prior to procedure a time out was called to  verify the correct patient, procedure, equipment, support staff and site/side marked as required. Patient was prepped and draped in the usual sterile fashion.       Clinical Data: No additional findings.  Objective: Vital Signs: There were no vitals taken for this visit.  Physical Exam:  Constitutional: Patient appears well-developed HEENT:  Head: Normocephalic Eyes:EOM are normal Neck: Normal range of motion Cardiovascular: Normal rate Pulmonary/chest: Effort normal Neurologic: Patient is alert Skin: Skin is warm Psychiatric: Patient has normal mood and affect  Ortho Exam: Ortho exam demonstrates range of motion on the right of 10-1 15.  On the left 3-1 15.  Collateral cruciate ligaments are stable.  Extensor mechanism intact.  Pedal pulses palpable.  Moderate effusion right knee trace effusion left knee.  Specialty Comments:  No specialty comments available.  Imaging: No results found.   PMFS History: Patient Active Problem List   Diagnosis Date Noted   Acute appendicitis 03/24/2016   Chest pain, atypical 04/12/2015   Solitary pulmonary nodule 04/11/2015   Past Medical History:  Diagnosis Date   ED (erectile dysfunction)    HLD (hyperlipidemia)    diet Rx   Kidney stones 1996   kidney stones removal    Family History  Problem Relation Age of Onset   Breast cancer Mother    Atrial fibrillation Father    Heart attack Cousin     Past Surgical  History:  Procedure Laterality Date   ELBOW SURGERY  1973   GANGLION CYST EXCISION  1982   Rt Foot   INGUINAL HERNIA REPAIR     KNEE SURGERY  2011   R knee arthroscopic   LAPAROSCOPIC APPENDECTOMY N/A 03/24/2016   Procedure: APPENDECTOMY LAPAROSCOPIC;  Surgeon: Luretha Murphy, MD;  Location: [facility];  Service: General;  Laterality: N/A;   LITHOTRIPSY     NEPHROSTOMY     UMBILICAL HERNIA REPAIR     Social History   Occupational History   Occupation: Flooring   Occupation: Real Estate  Tobacco Use   Smoking  status: Former    Current packs/day: 0.00    Average packs/day: 0.3 packs/day for 1 year (0.3 ttl pk-yrs)    Types: Cigarettes    Start date: 04/06/1974    Quit date: 04/07/1975    Years since quitting: 47.6   Smokeless tobacco: Never  Substance and Sexual Activity   Alcohol use: No    Alcohol/week: 0.0 standard drinks of alcohol   Drug use: No   Sexual activity: Not on file

## 2022-11-26 DIAGNOSIS — E7800 Pure hypercholesterolemia, unspecified: Secondary | ICD-10-CM | POA: Diagnosis not present

## 2022-11-26 DIAGNOSIS — Z125 Encounter for screening for malignant neoplasm of prostate: Secondary | ICD-10-CM | POA: Diagnosis not present

## 2022-11-26 DIAGNOSIS — Z0000 Encounter for general adult medical examination without abnormal findings: Secondary | ICD-10-CM | POA: Diagnosis not present

## 2022-11-26 DIAGNOSIS — M1A0720 Idiopathic chronic gout, left ankle and foot, without tophus (tophi): Secondary | ICD-10-CM | POA: Diagnosis not present

## 2022-12-15 DIAGNOSIS — R739 Hyperglycemia, unspecified: Secondary | ICD-10-CM | POA: Diagnosis not present

## 2022-12-30 ENCOUNTER — Ambulatory Visit: Payer: BC Managed Care – PPO | Admitting: Orthopedic Surgery

## 2023-01-06 ENCOUNTER — Other Ambulatory Visit (INDEPENDENT_AMBULATORY_CARE_PROVIDER_SITE_OTHER): Payer: Medicare Other

## 2023-01-06 ENCOUNTER — Ambulatory Visit (INDEPENDENT_AMBULATORY_CARE_PROVIDER_SITE_OTHER): Payer: Medicare Other | Admitting: Orthopedic Surgery

## 2023-01-06 DIAGNOSIS — M25512 Pain in left shoulder: Secondary | ICD-10-CM

## 2023-01-07 ENCOUNTER — Encounter: Payer: Self-pay | Admitting: Orthopedic Surgery

## 2023-01-07 NOTE — Progress Notes (Signed)
Office Visit Note   Patient: Pedro Castaneda           Date of Birth: 07/12/1955           MRN: [id number] Visit Date: 01/06/2023 Requested by: Noberto Retort, MD [phone] [address] [place],  [place] [address] PCP: Noberto Retort, MD  Subjective: Chief Complaint  Patient presents with   Left Shoulder - Pain    HPI: Pedro Castaneda is a 66 y.o. male who presents to the office reporting rather severe left shoulder pain for the past 6 weeks.  Did not have a discrete injury on that left-hand side but does report pinpoint pain anteriorly that makes him nauseated.  Sharp pain.  Denies any numbness and tingling or neck pain.  Pain does wake him from sleep most nights.  Cannot really lift or extend his arm due to this anterior pain.  The right knee injection helped him but now he reports clicking as well as gradual recurrence of significant arthritic type pain in that knee.  Abduction external rotation hurts that left shoulder.  Symptoms are getting worse.  Advil helps..                ROS: All systems reviewed are negative as they relate to the chief complaint within the history of present illness.  Patient denies fevers or chills.  Assessment & Plan: Visit Diagnoses:  1. Left shoulder pain, unspecified chronicity     Plan: Impression is left shoulder pain with normal radiographs.  I think this could be biceps related or rotator cuff related.  AC joint not discretely tender.  Does not look like it is calcific tendinitis.  He has failed a course of nonoperative treatment.  Due to the possibility of rotator cuff pathology which might require intervention I want to hold off on an injection until we have MRI scanning which shows operative versus nonoperative pathology in the shoulder.  Follow-up after that study.  Follow-Up Instructions: No follow-ups on file.   Orders:  Orders Placed This Encounter  Procedures   XR Shoulder Left   MR Shoulder Left w/ contrast   Arthrogram   No  orders of the defined types were placed in this encounter.     Procedures: No procedures performed   Clinical Data: No additional findings.  Objective: Vital Signs: There were no vitals taken for this visit.  Physical Exam:  Constitutional: Patient appears well-developed HEENT:  Head: Normocephalic Eyes:EOM are normal Neck: Normal range of motion Cardiovascular: Normal rate Pulmonary/chest: Effort normal Neurologic: Patient is alert Skin: Skin is warm Psychiatric: Patient has normal mood and affect  Ortho Exam: Ortho exam demonstrates mild effusion in the right knee no effusion left knee.  Slightly antalgic gait to the right.  Patellofemoral crepitus is present bilaterally.  Left shoulder demonstrates 5- out of 5 external rotation strength 5+ out of 5 subscap strength.  Biceps tendon is tender in the bicipital groove.  No restriction of passive external rotation or motion left versus right.  His motion is 50/95/165 bilaterally.  Little bit more crepitus on the left compared to the right with internal and external rotation of that left shoulder at 90 degrees of abduction.  Cervical spine range of motion full.  Specialty Comments:  No specialty comments available.  Imaging: No results found.   PMFS History: Patient Active Problem List   Diagnosis Date Noted   Acute appendicitis 03/24/2016   Chest pain, atypical 04/12/2015   Solitary  pulmonary nodule 04/11/2015   Past Medical History:  Diagnosis Date   ED (erectile dysfunction)    HLD (hyperlipidemia)    diet Rx   Kidney stones 1996   kidney stones removal    Family History  Problem Relation Age of Onset   Breast cancer Mother    Atrial fibrillation Father    Heart attack Cousin     Past Surgical History:  Procedure Laterality Date   ELBOW SURGERY  1973   GANGLION CYST EXCISION  1982   Rt Foot   INGUINAL HERNIA REPAIR     KNEE SURGERY  2011   R knee arthroscopic   LAPAROSCOPIC APPENDECTOMY N/A 03/24/2016    Procedure: APPENDECTOMY LAPAROSCOPIC;  Surgeon: Luretha Murphy, MD;  Location: [facility];  Service: General;  Laterality: N/A;   LITHOTRIPSY     NEPHROSTOMY     UMBILICAL HERNIA REPAIR     Social History   Occupational History   Occupation: Flooring   Occupation: Real Estate  Tobacco Use   Smoking status: Former    Current packs/day: 0.00    Average packs/day: 0.3 packs/day for 1 year (0.3 ttl pk-yrs)    Types: Cigarettes    Start date: 04/06/1974    Quit date: 04/07/1975    Years since quitting: 47.7   Smokeless tobacco: Never  Substance and Sexual Activity   Alcohol use: No    Alcohol/week: 0.0 standard drinks of alcohol   Drug use: No   Sexual activity: Not on file

## 2023-01-18 ENCOUNTER — Encounter: Payer: Self-pay | Admitting: Orthopedic Surgery

## 2023-01-18 ENCOUNTER — Ambulatory Visit (INDEPENDENT_AMBULATORY_CARE_PROVIDER_SITE_OTHER): Payer: Medicare Other | Admitting: Orthopedic Surgery

## 2023-01-18 DIAGNOSIS — M1711 Unilateral primary osteoarthritis, right knee: Secondary | ICD-10-CM

## 2023-01-18 DIAGNOSIS — M170 Bilateral primary osteoarthritis of knee: Secondary | ICD-10-CM | POA: Diagnosis not present

## 2023-01-18 NOTE — Progress Notes (Unsigned)
Office Visit Note   Patient: Pedro Castaneda           Date of Birth: [birth year]           MRN: [id number] Visit Date: 01/18/2023 Requested by: Noberto Retort, MD [phone] [address] [place],  [place] [address] PCP: Noberto Retort, MD  Subjective: Chief Complaint  Patient presents with   Right Knee - Pain, Weakness   Knee Pain    knee gave way, xrayed at [facility] Sat neg for fx, aspirated at [facility] Sunday, difficulty wtb    HPI: Pedro Castaneda is a 67 y.o. male who presents to the office reporting right knee pain.  Saturday he was getting off of a tractor and had a flexion type injury to his right knee.  The knee popped and he could not weight-bear.  He thinks he may have dislocated his patella.  Does report weakness clicking giving way.  Has known history of arthritis in the right knee.  Went to [facility] yesterday in aspiration and injection was performed but with significant pain from the procedure.  Still hard for him to fully extend the knee.  By report radiographs showed no fracture but significant arthritis in the medial compartment.  Oral steroids also started the day before clinic visit.  He needs to be able to walk by Thursday/Friday as he is supervising basketball coaching.  Patient cares for his brother who is disabled along with his 2 parents who also have multiple medical problems..                ROS: All systems reviewed are negative as they relate to the chief complaint within the history of present illness.  Patient denies fevers or chills.  Assessment & Plan: Visit Diagnoses: No diagnosis found.  Plan: Impression is right knee pain with injury getting off of the tractor.  Sounds like it was more of a flexion twisting type injury.  Patella feels stable on exam today with no MPFL tenderness.  I think he may have an unstable lateral meniscal tear possibly.  He is feeling some better compared to the time of the injury.  We can watch this for 48 hours and  decide for or against further intervention.  We talked about a hinged knee brace.  Come back Wednesday for repeat evaluation.  Follow-Up Instructions: No follow-ups on file.   Orders:  No orders of the defined types were placed in this encounter.  No orders of the defined types were placed in this encounter.     Procedures: No procedures performed   Clinical Data: No additional findings.  Objective: Vital Signs: There were no vitals taken for this visit.  Physical Exam:  Constitutional: Patient appears well-developed HEENT:  Head: Normocephalic Eyes:EOM are normal Neck: Normal range of motion Cardiovascular: Normal rate Pulmonary/chest: Effort normal Neurologic: Patient is alert Skin: Skin is warm Psychiatric: Patient has normal mood and affect  Ortho Exam: Ortho exam demonstrates mild effusion of the right knee.  Patient stable to varus and valgus stress.  ACL laxity difficult to assess due to guarding.  Pedal pulses palpable.  With some time he is able to get down to less than 10 degree flexion contracture.  Negative patellar apprehension and no tenderness over the medial epicondyle or MPFL attachment site on the medial aspect of the patella.  Specialty Comments:  No specialty comments available.  Imaging: No results found.   PMFS History: Patient Active Problem  List   Diagnosis Date Noted   Acute appendicitis 03/24/2016   Chest pain, atypical 04/12/2015   Solitary pulmonary nodule 04/11/2015   Past Medical History:  Diagnosis Date   ED (erectile dysfunction)    HLD (hyperlipidemia)    diet Rx   Kidney stones 1996   kidney stones removal    Family History  Problem Relation Age of Onset   Breast cancer Mother    Atrial fibrillation Father    Heart attack Cousin     Past Surgical History:  Procedure Laterality Date   ELBOW SURGERY  1973   GANGLION CYST EXCISION  1982   Rt Foot   INGUINAL HERNIA REPAIR     KNEE SURGERY  2011   R knee arthroscopic    LAPAROSCOPIC APPENDECTOMY N/A 03/24/2016   Procedure: APPENDECTOMY LAPAROSCOPIC;  Surgeon: Luretha Murphy, MD;  Location: [facility];  Service: General;  Laterality: N/A;   LITHOTRIPSY     NEPHROSTOMY     UMBILICAL HERNIA REPAIR     Social History   Occupational History   Occupation: Flooring   Occupation: Real Estate  Tobacco Use   Smoking status: Former    Current packs/day: 0.00    Average packs/day: 0.3 packs/day for 1 year (0.3 ttl pk-yrs)    Types: Cigarettes    Start date: 04/06/1974    Quit date: 04/07/1975    Years since quitting: 47.8   Smokeless tobacco: Never  Substance and Sexual Activity   Alcohol use: No    Alcohol/week: 0.0 standard drinks of alcohol   Drug use: No   Sexual activity: Not on file

## 2023-01-20 ENCOUNTER — Ambulatory Visit: Payer: Medicare Other | Admitting: Orthopedic Surgery

## 2023-01-26 ENCOUNTER — Inpatient Hospital Stay
Admission: RE | Admit: 2023-01-26 | Discharge: 2023-01-26 | Disposition: A | Discharge status: Home / Self Care | Payer: Medicare Other | Source: Ambulatory Visit | Attending: Orthopedic Surgery | Admitting: Orthopedic Surgery

## 2023-01-26 ENCOUNTER — Ambulatory Visit
Admission: RE | Admit: 2023-01-26 | Discharge: 2023-01-26 | Disposition: A | Discharge status: Home / Self Care | Payer: Medicare Other | Source: Ambulatory Visit | Attending: Orthopedic Surgery | Admitting: Orthopedic Surgery

## 2023-01-26 DIAGNOSIS — M25512 Pain in left shoulder: Secondary | ICD-10-CM

## 2023-01-26 MED ORDER — IOPAMIDOL (ISOVUE-M 200) INJECTION 41%
20.0000 mL | Freq: Once | INTRAMUSCULAR | Status: AC
  Administered 2023-01-26: 20 mL via INTRA_ARTICULAR

## 2023-02-01 NOTE — Progress Notes (Signed)
I called Pedro Castaneda.  Shoulder manageable.  He may call at some point to come in to get his knee looked at which is giving him more problems in the shoulder.  Could inject both at the same time.

## 2023-02-03 ENCOUNTER — Ambulatory Visit (INDEPENDENT_AMBULATORY_CARE_PROVIDER_SITE_OTHER): Payer: Medicare Other | Admitting: Orthopedic Surgery

## 2023-02-03 DIAGNOSIS — M170 Bilateral primary osteoarthritis of knee: Secondary | ICD-10-CM | POA: Diagnosis not present

## 2023-02-07 ENCOUNTER — Encounter: Payer: Self-pay | Admitting: Orthopedic Surgery

## 2023-02-07 NOTE — Progress Notes (Signed)
Office Visit Note   Patient: Pedro Castaneda           Date of Birth: 09-23-1955           MRN: [id number] Visit Date: 02/03/2023 Requested by: Noberto Retort, MD [phone] [address] [place],  [place] [address] PCP: Noberto Retort, MD  Subjective: Chief Complaint  Patient presents with   Other    Right knee pain/effusion    HPI: Pedro Castaneda is a 67 y.o. male who presents to the office reporting right knee pain.  6 weeks ago he had an injection in the right knee and was doing well with that.  Had a hyperflexion injury and then has had fairly debilitating pain since that time.  He does have end-stage arthritis in the right knee.  Mobic helps the swelling.  His parents have both recently passed away within a period of 2 weeks.  He is on Norco but has only taken about 6 tablets in 24 days time.   .                ROS: All systems reviewed are negative as they relate to the chief complaint within the history of present illness.  Patient denies fevers or chills.  Assessment & Plan: Visit Diagnoses:  1. Primary osteoarthritis of both knees     Plan: Impression is end-stage arthritis in the right knee.  Patient is very active with his company and family and volunteer activities.  Zilretta injection performed today.  101-month delay prior to knee replacement.  Hopefully this resets him to the point where he can become functional for the rest of the year.  If not he may be looking at knee replacement sometime next year.  Follow-Up Instructions: No follow-ups on file.   Orders:  No orders of the defined types were placed in this encounter.  No orders of the defined types were placed in this encounter.     Procedures: No procedures performed   Clinical Data: No additional findings.  Objective: Vital Signs: There were no vitals taken for this visit.  Physical Exam:  Constitutional: Patient appears well-developed HEENT:  Head: Normocephalic Eyes:EOM are  normal Neck: Normal range of motion Cardiovascular: Normal rate Pulmonary/chest: Effort normal Neurologic: Patient is alert Skin: Skin is warm Psychiatric: Patient has normal mood and affect  Ortho Exam: Ortho exam demonstrates mild right knee effusion.  This effusion is removed with the sample Zilretta injection.  Lot #[id number].  He has stable collateral cruciate ligaments.  Extensor mechanism intact.  Lacks about 5 degrees full extension but bends to 120.  Gait is slightly antalgic to the right.  Specialty Comments:  No specialty comments available.  Imaging: No results found.   PMFS History: Patient Active Problem List   Diagnosis Date Noted   Acute appendicitis 03/24/2016   Chest pain, atypical 04/12/2015   Solitary pulmonary nodule 04/11/2015   Past Medical History:  Diagnosis Date   ED (erectile dysfunction)    HLD (hyperlipidemia)    diet Rx   Kidney stones 1996   kidney stones removal    Family History  Problem Relation Age of Onset   Breast cancer Mother    Atrial fibrillation Father    Heart attack Cousin     Past Surgical History:  Procedure Laterality Date   ELBOW SURGERY  1973   GANGLION CYST EXCISION  1982   Rt Foot   INGUINAL HERNIA REPAIR  KNEE SURGERY  2011   R knee arthroscopic   LAPAROSCOPIC APPENDECTOMY N/A 03/24/2016   Procedure: APPENDECTOMY LAPAROSCOPIC;  Surgeon: Luretha Murphy, MD;  Location: [facility];  Service: General;  Laterality: N/A;   LITHOTRIPSY     NEPHROSTOMY     UMBILICAL HERNIA REPAIR     Social History   Occupational History   Occupation: Flooring   Occupation: Real Estate  Tobacco Use   Smoking status: Former    Current packs/day: 0.00    Average packs/day: 0.3 packs/day for 1 year (0.3 ttl pk-yrs)    Types: Cigarettes    Start date: 04/06/1974    Quit date: 04/07/1975    Years since quitting: 47.8   Smokeless tobacco: Never  Substance and Sexual Activity   Alcohol use: No    Alcohol/week: 0.0 standard drinks of  alcohol   Drug use: No   Sexual activity: Not on file

## 2023-02-26 ENCOUNTER — Encounter: Payer: Self-pay | Admitting: Orthopedic Surgery

## 2023-02-26 ENCOUNTER — Ambulatory Visit (INDEPENDENT_AMBULATORY_CARE_PROVIDER_SITE_OTHER): Payer: Medicare Other | Admitting: Orthopedic Surgery

## 2023-02-26 DIAGNOSIS — M1711 Unilateral primary osteoarthritis, right knee: Secondary | ICD-10-CM | POA: Diagnosis not present

## 2023-02-26 DIAGNOSIS — M170 Bilateral primary osteoarthritis of knee: Secondary | ICD-10-CM

## 2023-02-26 MED ORDER — CELECOXIB 100 MG PO CAPS: 100.0000 mg | ORAL_CAPSULE | Freq: Two times a day (BID) | ORAL | 0 refills | Status: DC

## 2023-02-26 NOTE — Progress Notes (Unsigned)
Office Visit Note   Patient: Pedro Castaneda           Date of Birth: [birth year]           MRN: [id number] Visit Date: 02/26/2023 Requested by: Noberto Retort, MD [phone] [address] [place],  [place] [address] PCP: Noberto Retort, MD  Subjective: Chief Complaint  Patient presents with   Right Knee - Pain    HPI: Pedro Castaneda is a 67 y.o. male who presents to the office reporting right knee pain.  Aspiration and injection performed 02/03/2023 with Zilretta.  He is having some pain at rest and pain at night.  Primarily medial sided pain.  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                ROS: All systems reviewed are negative as they relate to the chief complaint within the history of present illness.  Patient denies fevers or chills.  Assessment & Plan: Visit Diagnoses:  1. Primary osteoarthritis of both knees     Plan: Impression is persistent right knee pain with worsening of tricompartmental arthritis mostly on the medial side.  Celebrex and tramadol prescribed.  Aspiration and injection with Toradol performed today.  He may be looking at total knee replacement sometime in early February.  Follow-Up Instructions: No follow-ups on file.   Orders:  No orders of the defined types were placed in this encounter.  Meds ordered this encounter  Medications   celecoxib (CELEBREX) 100 MG capsule    Sig: Take 1 capsule (100 mg total) by mouth 2 (two) times daily.    Dispense:  60 capsule    Refill:  0      Procedures: Large Joint Inj: R knee on 02/26/2023 4:09 PM Indications: diagnostic evaluation, joint swelling and pain Details: 18 G 1.5 in needle, superolateral approach  Arthrogram: No  Medications: 5 mL lidocaine 1 %; 4 mL bupivacaine 0.25 % Outcome: tolerated well, no immediate complications Procedure, treatment alternatives, risks and benefits explained, specific risks discussed. Consent was given by the  patient. Immediately prior to procedure a time out was called to verify the correct patient, procedure, equipment, support staff and site/side marked as required. Patient was prepped and draped in the usual sterile fashion.    1 cc Toradol injected   Clinical Data: No additional findings.  Objective: Vital Signs: There were no vitals taken for this visit.  Physical Exam:  Constitutional: Patient appears well-developed HEENT:  Head: Normocephalic Eyes:EOM are normal Neck: Normal range of motion Cardiovascular: Normal rate Pulmonary/chest: Effort normal Neurologic: Patient is alert Skin: Skin is warm Psychiatric: Patient has normal mood and affect  Ortho Exam: Ortho exam demonstrates about a 7 degree flexion contracture on the right with flexion to 110.  Collateral crucial ligaments are stable.  Trace effusion present.  Medial greater than lateral joint line tenderness noted.  Specialty Comments:  No specialty comments available.  Imaging: No results found.   PMFS History: Patient Active Problem List   Diagnosis Date Noted   Acute appendicitis 03/24/2016   Chest pain, atypical 04/12/2015   Solitary pulmonary nodule 04/11/2015   Past Medical History:  Diagnosis Date   ED (erectile dysfunction)    HLD (hyperlipidemia)    diet Rx   Kidney stones 1996   kidney stones removal    Family History  Problem Relation Age of Onset   Breast cancer Mother    Atrial  fibrillation Father    Heart attack Cousin     Past Surgical History:  Procedure Laterality Date   ELBOW SURGERY  1973   GANGLION CYST EXCISION  1982   Rt Foot   INGUINAL HERNIA REPAIR     KNEE SURGERY  2011   R knee arthroscopic   LAPAROSCOPIC APPENDECTOMY N/A 03/24/2016   Procedure: APPENDECTOMY LAPAROSCOPIC;  Surgeon: Luretha Murphy, MD;  Location: [facility];  Service: General;  Laterality: N/A;   LITHOTRIPSY     NEPHROSTOMY     UMBILICAL HERNIA REPAIR     Social History   Occupational History    Occupation: Flooring   Occupation: Real Estate  Tobacco Use   Smoking status: Former    Current packs/day: 0.00    Average packs/day: 0.3 packs/day for 1 year (0.3 ttl pk-yrs)    Types: Cigarettes    Start date: 04/06/1974    Quit date: 04/07/1975    Years since quitting: 47.9   Smokeless tobacco: Never  Substance and Sexual Activity   Alcohol use: No    Alcohol/week: 0.0 standard drinks of alcohol   Drug use: No   Sexual activity: Not on file

## 2023-02-27 MED ORDER — TRAMADOL HCL 50 MG PO TABS: 50.0000 mg | ORAL_TABLET | Freq: Four times a day (QID) | ORAL | 0 refills | Status: DC | PRN

## 2023-02-27 MED ORDER — BUPIVACAINE HCL 0.25 % IJ SOLN
4.0000 mL | INTRAMUSCULAR | Status: AC | PRN
  Administered 2023-02-26: 4 mL via INTRA_ARTICULAR

## 2023-02-27 MED ORDER — LIDOCAINE HCL 1 % IJ SOLN
5.0000 mL | INTRAMUSCULAR | Status: AC | PRN
  Administered 2023-02-26: 5 mL

## 2023-05-03 NOTE — Progress Notes (Addendum)
Surgical Instructions   Your procedure is scheduled on Monday, February 3, 25.. Report to [facility] Main Entrance "A" at 5:30 A.M., then check in with the Admitting office. Any questions or running late day of surgery: call [phone]  Questions prior to your surgery date: call [phone], Monday-Friday, 8am-4pm. If you experience any cold or flu symptoms such as cough, fever, chills, shortness of breath, etc. between now and your scheduled surgery, please notify us at the above number.     Remember:  Do not eat after midnight the night before your surgery  You may drink clear liquids until 4:30 the morning of your surgery.   Clear liquids allowed are: Water, Non-Citrus Juices (without pulp), Carbonated Beverages, Clear Tea (no milk, honey, etc.), Black Coffee Only (NO MILK, CREAM OR POWDERED CREAMER of any kind), and Gatorade.  Patient Instructions  The night before surgery:  No food after midnight. ONLY clear liquids after midnight  The day of surgery  Drink ONE (1) Pre-Surgery Clear Ensure by 4:30 the morning of surgery. Drink in one sitting. Do not sip.  This drink was given to you during your hospital  pre-op appointment visit.  Nothing else to drink after completing the  Pre-Surgery Clear Ensure.         If you have questions, please contact your surgeon's office.     Take these medicines the morning of surgery with A SIP OF WATER: NONE   One week prior to surgery, STOP taking any Aspirin (unless otherwise instructed by your surgeon) Aleve, Naproxen, Ibuprofen, Motrin, Advil, Goody's, BC's, all herbal medications, fish oil, and non-prescription vitamins.                     Do NOT Smoke (Tobacco/Vaping) for 24 hours prior to your procedure.  If you use a CPAP at night, you may bring your mask/headgear for your overnight stay.   You will be asked to remove any contacts, glasses, piercing's, hearing aid's, dentures/partials prior to surgery. Please bring cases for  these items if needed.    Patients discharged the day of surgery will not be allowed to drive home, and someone needs to stay with them for 24 hours.  SURGICAL WAITING ROOM VISITATION Patients may have no more than 2 support people in the waiting area - these visitors may rotate.   Pre-op nurse will coordinate an appropriate time for 1 ADULT support person, who may not rotate, to accompany patient in pre-op.  Children under the age of 11 must have an adult with them who is not the patient and must remain in the main waiting area with an adult.  If the patient needs to stay at the hospital during part of their recovery, the visitor guidelines for inpatient rooms apply.  Please refer to the [facility] website for the visitor guidelines for any additional information.   If you received a COVID test during your pre-op visit  it is requested that you wear a mask when out in public, stay away from anyone that may not be feeling well and notify your surgeon if you develop symptoms. If you have been in contact with anyone that has tested positive in the last 10 days please notify you surgeon.      Pre-operative 5 CHG Bathing Instructions   You can play a key role in reducing the risk of infection after surgery. Your skin needs to be as free of germs as possible. You can reduce the number of germs  on your skin by washing with CHG (chlorhexidine gluconate) soap before surgery. CHG is an antiseptic soap that kills germs and continues to kill germs even after washing.   DO NOT use if you have an allergy to chlorhexidine/CHG or antibacterial soaps. If your skin becomes reddened or irritated, stop using the CHG and notify one of our RNs at [phone].   Please shower with the CHG soap starting 4 days before surgery using the following schedule:     Please keep in mind the following:  DO NOT shave, including legs and underarms, starting the day of your first shower.   You may shave your face at  any point before/day of surgery.  Place clean sheets on your bed the day you start using CHG soap. Use a clean washcloth (not used since being washed) for each shower. DO NOT sleep with pets once you start using the CHG.   CHG Shower Instructions:  Wash your face and private area with normal soap. If you choose to wash your hair, wash first with your normal shampoo.  After you use shampoo/soap, rinse your hair and body thoroughly to remove shampoo/soap residue.  Turn the water OFF and apply about 3 tablespoons (45 ml) of CHG soap to a CLEAN washcloth.  Apply CHG soap ONLY FROM YOUR NECK DOWN TO YOUR TOES (washing for 3-5 minutes)  DO NOT use CHG soap on face, private areas, open wounds, or sores.  Pay special attention to the area where your surgery is being performed.  If you are having back surgery, having someone wash your back for you may be helpful. Wait 2 minutes after CHG soap is applied, then you may rinse off the CHG soap.  Pat dry with a clean towel  Put on clean clothes/pajamas   If you choose to wear lotion, please use ONLY the CHG-compatible lotions that are listed below.  Additional instructions for the day of surgery: DO NOT APPLY any lotions, deodorants, or cologne.   Do not bring valuables to the hospital. [facility] is not responsible for any belongings/valuables. Do not wear nail polish, gel polish, artificial nails, or any other type of covering on natural nails (fingers and toes) Do not wear jewelry or makeup Put on clean/comfortable clothes.  Please brush your teeth.  Ask your nurse before applying any prescription medications to the skin.     CHG Compatible Lotions   Aveeno Moisturizing lotion  Cetaphil Moisturizing Cream  Cetaphil Moisturizing Lotion  Clairol Herbal Essence Moisturizing Lotion, Dry Skin  Clairol Herbal Essence Moisturizing Lotion, Extra Dry Skin  Clairol Herbal Essence Moisturizing Lotion, Normal Skin  Curel Age Defying Therapeutic  Moisturizing Lotion with Alpha Hydroxy  Curel Extreme Care Body Lotion  Curel Soothing Hands Moisturizing Hand Lotion  Curel Therapeutic Moisturizing Cream, Fragrance-Free  Curel Therapeutic Moisturizing Lotion, Fragrance-Free  Curel Therapeutic Moisturizing Lotion, Original Formula  Eucerin Daily Replenishing Lotion  Eucerin Dry Skin Therapy Plus Alpha Hydroxy Crme  Eucerin Dry Skin Therapy Plus Alpha Hydroxy Lotion  Eucerin Original Crme  Eucerin Original Lotion  Eucerin Plus Crme Eucerin Plus Lotion  Eucerin TriLipid Replenishing Lotion  Keri Anti-Bacterial Hand Lotion  Keri Deep Conditioning Original Lotion Dry Skin Formula Softly Scented  Keri Deep Conditioning Original Lotion, Fragrance Free Sensitive Skin Formula  Keri Lotion Fast Absorbing Fragrance Free Sensitive Skin Formula  Keri Lotion Fast Absorbing Softly Scented Dry Skin Formula  Keri Original Lotion  Keri Skin Renewal Lotion Keri Silky Smooth Lotion  Keri Silky Smooth Sensitive  Skin Lotion  Nivea Body Creamy Conditioning Oil  Nivea Body Extra Enriched Lotion  Nivea Body Original Lotion  Nivea Body Sheer Moisturizing Lotion Nivea Crme  Nivea Skin Firming Lotion  NutraDerm 30 Skin Lotion  NutraDerm Skin Lotion  NutraDerm Therapeutic Skin Cream  NutraDerm Therapeutic Skin Lotion  ProShield Protective Hand Cream  Provon moisturizing lotion  Please read over the following fact sheets that you were given.

## 2023-05-04 ENCOUNTER — Encounter (HOSPITAL_COMMUNITY): Payer: Self-pay

## 2023-05-04 ENCOUNTER — Other Ambulatory Visit: Payer: Self-pay

## 2023-05-04 ENCOUNTER — Encounter (HOSPITAL_COMMUNITY)
Admission: RE | Admit: 2023-05-04 | Discharge: 2023-05-04 | Disposition: A | Discharge status: Home / Self Care | Payer: Medicare Other | Source: Ambulatory Visit | Attending: Orthopedic Surgery | Admitting: Orthopedic Surgery

## 2023-05-04 ENCOUNTER — Encounter: Payer: Self-pay | Admitting: Orthopedic Surgery

## 2023-05-04 VITALS — BP 149/83 | HR 62 | Temp 97.8°F | Resp 17 | Ht 70.0 in | Wt 199.8 lb

## 2023-05-04 DIAGNOSIS — Z01818 Encounter for other preprocedural examination: Secondary | ICD-10-CM

## 2023-05-04 DIAGNOSIS — Z01812 Encounter for preprocedural laboratory examination: Secondary | ICD-10-CM | POA: Diagnosis not present

## 2023-05-04 HISTORY — DX: Squamous cell carcinoma of skin of left ear and external auricular canal: C44.229

## 2023-05-04 HISTORY — DX: Unilateral primary osteoarthritis, right knee: M17.11

## 2023-05-04 LAB — CBC
HCT: 45.1 % (ref 39.0–52.0)
Hemoglobin: 14.8 g/dL (ref 13.0–17.0)
MCH: 30.2 pg (ref 26.0–34.0)
MCHC: 32.8 g/dL (ref 30.0–36.0)
MCV: 92 fL (ref 80.0–100.0)
Platelets: 235 10*3/uL (ref 150–400)
RBC: 4.9 MIL/uL (ref 4.22–5.81)
RDW: 12.6 % (ref 11.5–15.5)
WBC: 4.4 10*3/uL (ref 4.0–10.5)
nRBC: 0 % (ref 0.0–0.2)

## 2023-05-04 LAB — BASIC METABOLIC PANEL
Anion gap: 5 (ref 5–15)
BUN: 18 mg/dL (ref 8–23)
CO2: 26 mmol/L (ref 22–32)
Calcium: 9.4 mg/dL (ref 8.9–10.3)
Chloride: 108 mmol/L (ref 98–111)
Creatinine, Ser: 1.04 mg/dL (ref 0.61–1.24)
GFR, Estimated: >60 mL/min (ref >60)
Glucose, Bld: 102 mg/dL — ABNORMAL HIGH (ref 70–99)
Potassium: 4.3 mmol/L (ref 3.5–5.1)
Sodium: 139 mmol/L (ref 135–145)

## 2023-05-04 LAB — URINALYSIS, W/ REFLEX TO CULTURE (INFECTION SUSPECTED)
Bacteria, UA: NONE SEEN
Bilirubin Urine: NEGATIVE
Glucose, UA: NEGATIVE mg/dL
Hgb urine dipstick: NEGATIVE
Ketones, ur: NEGATIVE mg/dL
Leukocytes,Ua: NEGATIVE
Nitrite: NEGATIVE
Protein, ur: NEGATIVE mg/dL
Specific Gravity, Urine: 1.02 (ref 1.005–1.030)
pH: 6 (ref 5.0–8.0)

## 2023-05-04 LAB — SURGICAL PCR SCREEN
MRSA, PCR: NEGATIVE
Staphylococcus aureus: NEGATIVE

## 2023-05-04 NOTE — Progress Notes (Signed)
PCP - Dr. Johny Blamer Cardiologist - denies  PPM/ICD - denies   Chest x-ray - denies EKG - 04/12/18 (n/a) Stress Test - 04/03/15 ECHO - denies Cardiac Cath - denies  Sleep Study - OSA+ CPAP - nightly, pt unsure of pressure settings  DM- denies  Last dose of GLP1 agonist-  n/a   ASA/Blood Thinner Instructions: n/a   ERAS Protcol - yes PRE-SURGERY Ensure given at PAT  COVID TEST- n/a   Anesthesia review: no  Patient denies shortness of breath, fever, cough and chest pain at PAT appointment   All instructions explained to the patient, with a verbal understanding of the material. Patient agrees to go over the instructions while at home for a better understanding. The opportunity to ask questions was provided.

## 2023-05-07 MED ORDER — TRANEXAMIC ACID 1000 MG/10ML IV SOLN
2000.0000 mg | INTRAVENOUS | Status: DC
  Filled 2023-05-07: qty 20

## 2023-05-09 NOTE — Anesthesia Preprocedure Evaluation (Signed)
Anesthesia Evaluation  Patient identified by MRN, date of birth, ID band Patient awake    Reviewed: Allergy & Precautions, H&P , NPO status , Patient's Chart, lab work & pertinent test results  Airway Mallampati: II  TM Distance: >3 FB Neck ROM: Full    Dental no notable dental hx. (+) Teeth Intact, Dental Advisory Given   Pulmonary neg pulmonary ROS, former smoker   Pulmonary exam normal breath sounds clear to auscultation       Cardiovascular Exercise Tolerance: Good negative cardio ROS Normal cardiovascular exam Rhythm:Regular Rate:Normal     Neuro/Psych negative neurological ROS  negative psych ROS   GI/Hepatic negative GI ROS, Neg liver ROS,,,  Endo/Other  negative endocrine ROS    Renal/GU Renal diseasenegative Renal ROS  negative genitourinary   Musculoskeletal negative musculoskeletal ROS (+) Arthritis , Osteoarthritis,    Abdominal   Peds negative pediatric ROS (+)  Hematology negative hematology ROS (+)   Anesthesia Other Findings   Reproductive/Obstetrics negative OB ROS                             Anesthesia Physical Anesthesia Plan  ASA: 2  Anesthesia Plan: MAC, Regional and Spinal   Post-op Pain Management: Regional block*, Tylenol PO (pre-op)* and Celebrex PO (pre-op)*   Induction: Intravenous  PONV Risk Score and Plan: 1 and Propofol infusion  Airway Management Planned: Natural Airway, Simple Face Mask and Mask  Additional Equipment: None  Intra-op Plan:   Post-operative Plan:   Informed Consent: I have reviewed the patients History and Physical, chart, labs and discussed the procedure including the risks, benefits and alternatives for the proposed anesthesia with the patient or authorized representative who has indicated his/her understanding and acceptance.       Plan Discussed with: Anesthesiologist and CRNA  Anesthesia Plan Comments:          Anesthesia Quick Evaluation

## 2023-05-10 ENCOUNTER — Other Ambulatory Visit: Payer: Self-pay

## 2023-05-10 ENCOUNTER — Encounter (HOSPITAL_COMMUNITY)
Admission: RE | Disposition: A | Discharge status: Home Health | Payer: Self-pay | Source: Home / Self Care | Attending: Orthopedic Surgery

## 2023-05-10 ENCOUNTER — Inpatient Hospital Stay (HOSPITAL_COMMUNITY)
Admission: RE | Admit: 2023-05-10 | Discharge: 2023-05-12 | DRG: 470 | Disposition: A | Discharge status: Home Health | Payer: Medicare Other | Attending: Orthopedic Surgery | Admitting: Orthopedic Surgery

## 2023-05-10 ENCOUNTER — Ambulatory Visit (HOSPITAL_COMMUNITY): Payer: Self-pay | Admitting: Anesthesiology

## 2023-05-10 ENCOUNTER — Encounter (HOSPITAL_COMMUNITY): Payer: Self-pay | Admitting: Orthopedic Surgery

## 2023-05-10 DIAGNOSIS — G8929 Other chronic pain: Secondary | ICD-10-CM | POA: Diagnosis present

## 2023-05-10 DIAGNOSIS — Z96651 Presence of right artificial knee joint: Secondary | ICD-10-CM

## 2023-05-10 DIAGNOSIS — Z01818 Encounter for other preprocedural examination: Principal | ICD-10-CM

## 2023-05-10 DIAGNOSIS — Z96659 Presence of unspecified artificial knee joint: Secondary | ICD-10-CM

## 2023-05-10 DIAGNOSIS — M1711 Unilateral primary osteoarthritis, right knee: Principal | ICD-10-CM

## 2023-05-10 DIAGNOSIS — S83281A Other tear of lateral meniscus, current injury, right knee, initial encounter: Secondary | ICD-10-CM | POA: Diagnosis present

## 2023-05-10 DIAGNOSIS — X58XXXA Exposure to other specified factors, initial encounter: Secondary | ICD-10-CM | POA: Diagnosis present

## 2023-05-10 DIAGNOSIS — Z8249 Family history of ischemic heart disease and other diseases of the circulatory system: Secondary | ICD-10-CM

## 2023-05-10 DIAGNOSIS — I2510 Atherosclerotic heart disease of native coronary artery without angina pectoris: Secondary | ICD-10-CM | POA: Diagnosis present

## 2023-05-10 DIAGNOSIS — J449 Chronic obstructive pulmonary disease, unspecified: Secondary | ICD-10-CM | POA: Diagnosis present

## 2023-05-10 DIAGNOSIS — M179 Osteoarthritis of knee, unspecified: Secondary | ICD-10-CM | POA: Diagnosis present

## 2023-05-10 DIAGNOSIS — Z87891 Personal history of nicotine dependence: Secondary | ICD-10-CM

## 2023-05-10 HISTORY — PX: TOTAL KNEE ARTHROPLASTY: SHX125

## 2023-05-10 SURGERY — ARTHROPLASTY, KNEE, TOTAL
Anesthesia: Monitor Anesthesia Care | Site: Knee | Laterality: Right

## 2023-05-10 MED ORDER — OXYCODONE HCL 5 MG/5ML PO SOLN: 5.0000 mg | Freq: Once | ORAL | Status: DC | PRN

## 2023-05-10 MED ORDER — ACETAMINOPHEN 325 MG PO TABS: 325.0000 mg | ORAL_TABLET | Freq: Four times a day (QID) | ORAL | Status: DC | PRN

## 2023-05-10 MED ORDER — SODIUM CHLORIDE (PF) 0.9 % IJ SOLN
INTRAMUSCULAR | Status: DC | PRN
  Administered 2023-05-10: 60 mL via INTRAMUSCULAR

## 2023-05-10 MED ORDER — SODIUM CHLORIDE 0.9 % IV SOLN: INTRAVENOUS | Status: DC | PRN

## 2023-05-10 MED ORDER — TRANEXAMIC ACID-NACL 1000-0.7 MG/100ML-% IV SOLN
1000.0000 mg | INTRAVENOUS | Status: AC
  Administered 2023-05-10: 1000 mg via INTRAVENOUS
  Filled 2023-05-10: qty 100

## 2023-05-10 MED ORDER — ONDANSETRON HCL 4 MG/2ML IJ SOLN: 4.0000 mg | Freq: Once | INTRAMUSCULAR | Status: DC | PRN

## 2023-05-10 MED ORDER — POVIDONE-IODINE 10 % EX SWAB
2.0000 | Freq: Once | CUTANEOUS | Status: AC
  Administered 2023-05-10: 2 via TOPICAL

## 2023-05-10 MED ORDER — OXYCODONE HCL 5 MG PO TABS
5.0000 mg | ORAL_TABLET | ORAL | Status: DC | PRN
Start: 2023-05-10 — End: 2023-05-11
  Administered 2023-05-10 – 2023-05-11 (×3): 10 mg via ORAL
  Filled 2023-05-10 (×3): qty 2

## 2023-05-10 MED ORDER — MORPHINE SULFATE (PF) 4 MG/ML IV SOLN
INTRAVENOUS | Status: DC | PRN
  Administered 2023-05-10: 13 mL via INTRAMUSCULAR

## 2023-05-10 MED ORDER — DOCUSATE SODIUM 100 MG PO CAPS
100.0000 mg | ORAL_CAPSULE | Freq: Two times a day (BID) | ORAL | Status: DC
  Administered 2023-05-10 – 2023-05-12 (×5): 100 mg via ORAL
  Filled 2023-05-10 (×5): qty 1

## 2023-05-10 MED ORDER — OXYCODONE HCL 5 MG PO TABS: 5.0000 mg | ORAL_TABLET | Freq: Once | ORAL | Status: DC | PRN

## 2023-05-10 MED ORDER — FENTANYL CITRATE (PF) 100 MCG/2ML IJ SOLN
INTRAMUSCULAR | Status: AC
  Filled 2023-05-10: qty 2

## 2023-05-10 MED ORDER — BUPIVACAINE LIPOSOME 1.3 % IJ SUSP
INTRAMUSCULAR | Status: AC
  Filled 2023-05-10: qty 20

## 2023-05-10 MED ORDER — BUPIVACAINE HCL (PF) 0.25 % IJ SOLN
INTRAMUSCULAR | Status: AC
  Filled 2023-05-10: qty 30

## 2023-05-10 MED ORDER — HYDROMORPHONE HCL 1 MG/ML IJ SOLN
0.5000 mg | INTRAMUSCULAR | Status: DC | PRN
  Administered 2023-05-10 – 2023-05-11 (×4): 0.5 mg via INTRAVENOUS
  Filled 2023-05-10 (×4): qty 0.5

## 2023-05-10 MED ORDER — VANCOMYCIN HCL 1000 MG IV SOLR
INTRAVENOUS | Status: DC | PRN
  Administered 2023-05-10: 1000 mg via TOPICAL

## 2023-05-10 MED ORDER — CHLORHEXIDINE GLUCONATE 0.12 % MT SOLN
15.0000 mL | Freq: Once | OROMUCOSAL | Status: AC
  Administered 2023-05-10: 15 mL via OROMUCOSAL
  Filled 2023-05-10: qty 15

## 2023-05-10 MED ORDER — PROPOFOL 1000 MG/100ML IV EMUL
INTRAVENOUS | Status: AC
  Filled 2023-05-10: qty 100

## 2023-05-10 MED ORDER — ACETAMINOPHEN 325 MG PO TABS: 325.0000 mg | ORAL_TABLET | ORAL | Status: DC | PRN

## 2023-05-10 MED ORDER — MEPERIDINE HCL 25 MG/ML IJ SOLN: 6.2500 mg | INTRAMUSCULAR | Status: DC | PRN

## 2023-05-10 MED ORDER — ASPIRIN 81 MG PO CHEW
81.0000 mg | CHEWABLE_TABLET | Freq: Two times a day (BID) | ORAL | Status: DC
  Administered 2023-05-10 – 2023-05-12 (×3): 81 mg via ORAL
  Filled 2023-05-10 (×5): qty 1

## 2023-05-10 MED ORDER — PROPOFOL 10 MG/ML IV BOLUS
INTRAVENOUS | Status: AC
  Filled 2023-05-10: qty 20

## 2023-05-10 MED ORDER — MIDAZOLAM HCL 2 MG/2ML IJ SOLN
INTRAMUSCULAR | Status: DC | PRN
  Administered 2023-05-10: 2 mg via INTRAVENOUS

## 2023-05-10 MED ORDER — TRANEXAMIC ACID 1000 MG/10ML IV SOLN
INTRAVENOUS | Status: DC | PRN
  Administered 2023-05-10: 2000 mg via TOPICAL

## 2023-05-10 MED ORDER — IRRISEPT - 450ML BOTTLE WITH 0.05% CHG IN STERILE WATER, USP 99.95% OPTIME
TOPICAL | Status: DC | PRN
  Administered 2023-05-10: 450 mL via TOPICAL

## 2023-05-10 MED ORDER — METHOCARBAMOL 500 MG PO TABS
500.0000 mg | ORAL_TABLET | Freq: Four times a day (QID) | ORAL | Status: DC | PRN
  Administered 2023-05-11: 500 mg via ORAL
  Filled 2023-05-10: qty 1

## 2023-05-10 MED ORDER — MENTHOL 3 MG MT LOZG: 1.0000 | LOZENGE | OROMUCOSAL | Status: DC | PRN

## 2023-05-10 MED ORDER — MIDAZOLAM HCL 2 MG/2ML IJ SOLN
INTRAMUSCULAR | Status: AC
  Filled 2023-05-10: qty 2

## 2023-05-10 MED ORDER — POVIDONE-IODINE 7.5 % EX SOLN
Freq: Once | CUTANEOUS | Status: DC
  Filled 2023-05-10: qty 118

## 2023-05-10 MED ORDER — CLONIDINE HCL (ANALGESIA) 100 MCG/ML EP SOLN
EPIDURAL | Status: AC
  Filled 2023-05-10: qty 10

## 2023-05-10 MED ORDER — ROPIVACAINE HCL 5 MG/ML IJ SOLN
INTRAMUSCULAR | Status: DC | PRN
  Administered 2023-05-10: 20 mL via PERINEURAL

## 2023-05-10 MED ORDER — VANCOMYCIN HCL 1000 MG IV SOLR
INTRAVENOUS | Status: AC
  Filled 2023-05-10: qty 20

## 2023-05-10 MED ORDER — VITAMIN D 25 MCG (1000 UNIT) PO TABS
2000.0000 [IU] | ORAL_TABLET | Freq: Every day | ORAL | Status: DC
  Administered 2023-05-11 – 2023-05-12 (×2): 2000 [IU] via ORAL
  Filled 2023-05-10 (×4): qty 2

## 2023-05-10 MED ORDER — CEFAZOLIN SODIUM-DEXTROSE 2-4 GM/100ML-% IV SOLN
2.0000 g | INTRAVENOUS | Status: AC
  Administered 2023-05-10: 2 g via INTRAVENOUS
  Filled 2023-05-10: qty 100

## 2023-05-10 MED ORDER — ORAL CARE MOUTH RINSE: 15.0000 mL | Freq: Once | OROMUCOSAL | Status: AC

## 2023-05-10 MED ORDER — MORPHINE SULFATE (PF) 4 MG/ML IV SOLN
INTRAVENOUS | Status: AC
  Filled 2023-05-10: qty 2

## 2023-05-10 MED ORDER — ACETAMINOPHEN 500 MG PO TABS
1000.0000 mg | ORAL_TABLET | Freq: Once | ORAL | Status: AC
  Administered 2023-05-10: 1000 mg via ORAL
  Filled 2023-05-10: qty 2

## 2023-05-10 MED ORDER — METHOCARBAMOL 1000 MG/10ML IJ SOLN
INTRAMUSCULAR | Status: AC
  Filled 2023-05-10: qty 10

## 2023-05-10 MED ORDER — PHENOL 1.4 % MT LIQD: 1.0000 | OROMUCOSAL | Status: DC | PRN

## 2023-05-10 MED ORDER — SODIUM CHLORIDE 0.9 % IR SOLN
Status: DC | PRN
  Administered 2023-05-10: 3000 mL

## 2023-05-10 MED ORDER — OXYCODONE HCL 5 MG PO TABS
ORAL_TABLET | ORAL | Status: AC
  Administered 2023-05-11: 10 mg via ORAL
  Filled 2023-05-10: qty 2

## 2023-05-10 MED ORDER — CELECOXIB 200 MG PO CAPS
200.0000 mg | ORAL_CAPSULE | Freq: Once | ORAL | Status: AC
  Administered 2023-05-10: 200 mg via ORAL
  Filled 2023-05-10: qty 1

## 2023-05-10 MED ORDER — 0.9 % SODIUM CHLORIDE (POUR BTL) OPTIME
TOPICAL | Status: DC | PRN
  Administered 2023-05-10: 1000 mL

## 2023-05-10 MED ORDER — PROPOFOL 500 MG/50ML IV EMUL
INTRAVENOUS | Status: DC | PRN
  Administered 2023-05-10: 50 ug/kg/min via INTRAVENOUS

## 2023-05-10 MED ORDER — FENTANYL CITRATE (PF) 100 MCG/2ML IJ SOLN
25.0000 ug | INTRAMUSCULAR | Status: DC | PRN
  Administered 2023-05-10 (×3): 50 ug via INTRAVENOUS
  Administered 2023-05-10: 100 ug via INTRAVENOUS

## 2023-05-10 MED ORDER — DEXMEDETOMIDINE HCL IN NACL 80 MCG/20ML IV SOLN
INTRAVENOUS | Status: DC | PRN
  Administered 2023-05-10: 12 ug via INTRAVENOUS
  Administered 2023-05-10: 8 ug via INTRAVENOUS

## 2023-05-10 MED ORDER — LACTATED RINGERS IV SOLN: INTRAVENOUS | Status: DC

## 2023-05-10 MED ORDER — POVIDONE-IODINE 10 % EX SWAB: 2.0000 | Freq: Once | CUTANEOUS | Status: DC

## 2023-05-10 MED ORDER — METHOCARBAMOL 1000 MG/10ML IJ SOLN
500.0000 mg | Freq: Four times a day (QID) | INTRAMUSCULAR | Status: DC | PRN
  Administered 2023-05-10: 500 mg via INTRAVENOUS

## 2023-05-10 MED ORDER — ACETAMINOPHEN 160 MG/5ML PO SOLN
325.0000 mg | ORAL | Status: DC | PRN
Start: 2023-05-10 — End: 2023-05-10

## 2023-05-10 SURGICAL SUPPLY — 74 items
BAG COUNTER SPONGE SURGICOUNT (BAG) ×1 IMPLANT
BAG DECANTER FOR FLEXI CONT (MISCELLANEOUS) ×1 IMPLANT
BANDAGE ESMARK 6X9 LF (GAUZE/BANDAGES/DRESSINGS) ×1 IMPLANT
BIT DRILL QUICK REL 1/8 2PK SL (BIT) IMPLANT
BLADE SAG 18X100X1.27 (BLADE) ×1 IMPLANT
BLADE SAW SGTL 13X75X1.27 (BLADE) ×1 IMPLANT
BLADE SAW THK.89X75X18XSGTL (BLADE) ×1 IMPLANT
BNDG COHESIVE 6X5 TAN ST LF (GAUZE/BANDAGES/DRESSINGS) ×1 IMPLANT
BNDG ELASTIC 6X10 VLCR STRL LF (GAUZE/BANDAGES/DRESSINGS) IMPLANT
BNDG ELASTIC 6X15 VLCR STRL LF (GAUZE/BANDAGES/DRESSINGS) ×1 IMPLANT
BNDG ESMARK 6X9 LF (GAUZE/BANDAGES/DRESSINGS) ×1 IMPLANT
BOWL SMART MIX CTS (DISPOSABLE) IMPLANT
CNTNR URN SCR LID CUP LEK RST (MISCELLANEOUS) ×1 IMPLANT
COMP PATELLA 3 PEG 38 (Joint) ×1 IMPLANT
COMP TIB KNEE PS G 0 RT (Joint) ×1 IMPLANT
COMPONENT PATELLA 3 PEG 38 (Joint) IMPLANT
COMPONENT TIB KNEE PS G 0 RT (Joint) IMPLANT
COOLER ICEMAN CLASSIC (MISCELLANEOUS) IMPLANT
COVER SURGICAL LIGHT HANDLE (MISCELLANEOUS) ×1 IMPLANT
CUFF TOURN SGL QUICK 42 (TOURNIQUET CUFF) IMPLANT
CUFF TRNQT CYL 34X4.125X (TOURNIQUET CUFF) ×1 IMPLANT
DRAPE INCISE IOBAN 66X45 STRL (DRAPES) IMPLANT
DRAPE SURG ORHT 6 SPLT 77X108 (DRAPES) ×3 IMPLANT
DRAPE U-SHAPE 47X51 STRL (DRAPES) ×1 IMPLANT
DRSG AQUACEL AG ADV 3.5X14 (GAUZE/BANDAGES/DRESSINGS) IMPLANT
DURAPREP 26ML APPLICATOR (WOUND CARE) ×2 IMPLANT
ELECT CAUTERY BLADE 6.4 (BLADE) ×1 IMPLANT
ELECT REM PT RETURN 9FT ADLT (ELECTROSURGICAL) ×1 IMPLANT
ELECTRODE REM PT RTRN 9FT ADLT (ELECTROSURGICAL) ×1 IMPLANT
GAUZE SPONGE 4X4 12PLY STRL (GAUZE/BANDAGES/DRESSINGS) ×1 IMPLANT
GLOVE BIOGEL PI IND STRL 7.0 (GLOVE) ×1 IMPLANT
GLOVE BIOGEL PI IND STRL 8 (GLOVE) ×1 IMPLANT
GLOVE ECLIPSE 7.0 STRL STRAW (GLOVE) ×1 IMPLANT
GLOVE ECLIPSE 8.0 STRL XLNG CF (GLOVE) ×1 IMPLANT
GLOVE SURG ENC MOIS LTX SZ6.5 (GLOVE) ×3 IMPLANT
GOWN STRL REUS W/ TWL LRG LVL3 (GOWN DISPOSABLE) ×3 IMPLANT
HOOD PEEL AWAY T7 (MISCELLANEOUS) ×3 IMPLANT
IMMOBILIZER KNEE 20 (SOFTGOODS) IMPLANT
IMMOBILIZER KNEE 20 THIGH 36 (SOFTGOODS) IMPLANT
IMMOBILIZER KNEE 22 UNIV (SOFTGOODS) IMPLANT
IMMOBILIZER KNEE 24 THIGH 36 (MISCELLANEOUS) IMPLANT
IMMOBILIZER KNEE 24 UNIV (MISCELLANEOUS) ×1 IMPLANT
INSERT TIB ASF PS 8-11 GH RT (Insert) IMPLANT
KIT BASIN OR (CUSTOM PROCEDURE TRAY) ×1 IMPLANT
KIT TURNOVER KIT B (KITS) ×1 IMPLANT
MANIFOLD NEPTUNE II (INSTRUMENTS) ×1 IMPLANT
NDL 22X1.5 STRL (OR ONLY) (MISCELLANEOUS) ×2 IMPLANT
NDL SPNL 18GX3.5 QUINCKE PK (NEEDLE) ×1 IMPLANT
NEEDLE 22X1.5 STRL (OR ONLY) (MISCELLANEOUS) ×2 IMPLANT
NEEDLE SPNL 18GX3.5 QUINCKE PK (NEEDLE) ×1 IMPLANT
NS IRRIG 1000ML POUR BTL (IV SOLUTION) ×2 IMPLANT
PACK TOTAL JOINT (CUSTOM PROCEDURE TRAY) ×1 IMPLANT
PAD ARMBOARD 7.5X6 YLW CONV (MISCELLANEOUS) ×2 IMPLANT
PAD CAST 4YDX4 CTTN HI CHSV (CAST SUPPLIES) ×1 IMPLANT
PADDING CAST COTTON 6X4 STRL (CAST SUPPLIES) ×1 IMPLANT
PROS FEM KNEE PS STD 9 RT (Joint) ×1 IMPLANT
PROSTHESIS FEM KNEE PS STD9 RT (Joint) IMPLANT
SCREW FEMALE HEX FIX 25X2.5 (ORTHOPEDIC DISPOSABLE SUPPLIES) IMPLANT
SET HNDPC FAN SPRY TIP SCT (DISPOSABLE) ×1 IMPLANT
SPIKE FLUID TRANSFER (MISCELLANEOUS) ×1 IMPLANT
STRIP CLOSURE SKIN 1/2X4 (GAUZE/BANDAGES/DRESSINGS) ×2 IMPLANT
SUCTION TUBE FRAZIER 10FR DISP (SUCTIONS) ×1 IMPLANT
SUT MNCRL AB 3-0 PS2 18 (SUTURE) ×1 IMPLANT
SUT VIC AB 0 CT1 27XBRD ANBCTR (SUTURE) ×3 IMPLANT
SUT VIC AB 1 CT1 36 (SUTURE) ×5 IMPLANT
SUT VIC AB 2-0 CT2 27 (SUTURE) ×4 IMPLANT
SUT VICRYL 0 27 CT2 27 ABS (SUTURE) ×1 IMPLANT
SYR 30ML LL (SYRINGE) ×3 IMPLANT
SYR TB 1ML LUER SLIP (SYRINGE) ×1 IMPLANT
TOWEL GREEN STERILE (TOWEL DISPOSABLE) ×2 IMPLANT
TOWEL GREEN STERILE FF (TOWEL DISPOSABLE) ×2 IMPLANT
TRAY CATH INTERMITTENT SS 16FR (CATHETERS) IMPLANT
WATER STERILE IRR 1000ML POUR (IV SOLUTION) IMPLANT
YANKAUER SUCT BULB TIP NO VENT (SUCTIONS) ×1 IMPLANT

## 2023-05-10 NOTE — H&P (Signed)
TOTAL KNEE ADMISSION H&P  Patient is being admitted for right total knee arthroplasty.  Subjective:  Chief Complaint:right knee pain.  HPI: Pedro Castaneda, 68 y.o. male, has a history of pain and functional disability in the right knee due to arthritis and has failed non-surgical conservative treatments for greater than 12 weeks to includeNSAID's and/or analgesics, corticosteriod injections, viscosupplementation injections, flexibility and strengthening excercises, weight reduction as appropriate, and activity modification.  Onset of symptoms was gradual, starting >10 years ago with gradually worsening course since that time. The patient noted prior procedures on the knee to include  arthroscopy and menisectomy on the right knee(s).  Patient currently rates pain in the right knee(s) at 8 out of 10 with activity. Patient has night pain, worsening of pain with activity and weight bearing, pain that interferes with activities of daily living, pain with passive range of motion, crepitus, and joint swelling.  Patient has evidence of subchondral sclerosis and joint space narrowing by imaging studies. This patient has had  a long course of conservative treatment with diminishing returns. No personal or family history of dvt or pe. . There is no active infection.  Patient Active Problem List   Diagnosis Date Noted   Acute appendicitis 03/24/2016   Chest pain, atypical 04/12/2015   Solitary pulmonary nodule 04/11/2015   Past Medical History:  Diagnosis Date   ED (erectile dysfunction)    HLD (hyperlipidemia)    diet Rx   Kidney stones 04/06/1994   kidney stones removal   Osteoarthritis of right knee    Squamous cell cancer of external ear, left    plan to do a MOHS procedure in the near future per pt    Past Surgical History:  Procedure Laterality Date   ELBOW SURGERY Right 1973   GANGLION CYST EXCISION  1982   Rt Foot   INGUINAL HERNIA REPAIR Right    KNEE SURGERY  2011   R knee  arthroscopic   LAPAROSCOPIC APPENDECTOMY N/A 03/24/2016   Procedure: APPENDECTOMY LAPAROSCOPIC;  Surgeon: Luretha Murphy, MD;  Location: [facility];  Service: General;  Laterality: N/A;   LITHOTRIPSY     NEPHROSTOMY     UMBILICAL HERNIA REPAIR      Current Facility-Administered Medications  Medication Dose Route Frequency Provider Last Rate Last Admin   ceFAZolin (ANCEF) IVPB 2g/100 mL premix  2 g Intravenous On Call to OR Magnant, Joycie Peek, PA-C       lactated ringers infusion   Intravenous Continuous Bethena Midget, MD 10 mL/hr at 05/10/23 0626 New Bag at 05/10/23 0626   povidone-iodine (BETADINE) 7.5 % scrub   Topical Once Magnant, Charles L, PA-C       povidone-iodine 10 % swab 2 Application  2 Application Topical Once Magnant, Charles L, PA-C       tranexamic acid (CYKLOKAPRON) 2,000 mg in sodium chloride 0.9 % 50 mL Topical Application  2,000 mg Topical To OR August Saucer, Corrie Mckusick, MD       tranexamic acid (CYKLOKAPRON) IVPB 1,000 mg  1,000 mg Intravenous To OR Magnant, Charles L, PA-C       No Known Allergies  Social History   Tobacco Use   Smoking status: Former    Current packs/day: 0.00    Average packs/day: 0.3 packs/day for 1 year (0.3 ttl pk-yrs)    Types: Cigarettes    Start date: 04/06/1974    Quit date: 04/07/1975    Years since quitting: 48.1   Smokeless tobacco: Never  Substance Use  Topics   Alcohol use: No    Alcohol/week: 0.0 standard drinks of alcohol    Family History  Problem Relation Age of Onset   Breast cancer Mother    Atrial fibrillation Father    Heart attack Cousin      Review of Systems  Musculoskeletal:  Positive for arthralgias.  All other systems reviewed and are negative.   Objective:  Physical Exam Vitals reviewed.  HENT:     Head: Normocephalic.     Nose: Nose normal.     Mouth/Throat:     Mouth: Mucous membranes are moist.  Eyes:     Pupils: Pupils are equal, round, and reactive to light.  Cardiovascular:     Rate and Rhythm: Normal  rate.     Pulses: Normal pulses.  Pulmonary:     Effort: Pulmonary effort is normal.  Abdominal:     General: Abdomen is flat.  Musculoskeletal:     Cervical back: Normal range of motion.  Skin:    General: Skin is warm.     Capillary Refill: Capillary refill takes less than 2 seconds.  Neurological:     General: No focal deficit present.     Mental Status: He is alert.  Psychiatric:        Mood and Affect: Mood normal.    Ortho exam demonstrates about a 7 degree flexion contracture on the right with flexion to 110.  Collateral crucial ligaments are stable.  Trace effusion present.  Medial greater than lateral joint line tenderness noted. Skin intact. No nerve root tension signs or groin pain Vital signs in last 24 hours: Temp:  [98.1 F (36.7 C)] 98.1 F (36.7 C) (02/03 0556) Pulse Rate:  [67] 67 (02/03 0556) Resp:  [18] 18 (02/03 0556) BP: (138)/(78) 138/78 (02/03 0556) SpO2:  [95 %] 95 % (02/03 0556) Weight:  [87.5 kg] 87.5 kg (02/03 0556)  Labs:   Estimated body mass index is 27.69 kg/m as calculated from the following:   Height as of this encounter: 5\' 10"  (1.778 m).   Weight as of this encounter: 87.5 kg.   Imaging Review Plain radiographs demonstrate severe degenerative joint disease of the right knee(s). The overall alignment ismild varus. The bone quality appears to be good for age and reported activity level.      Assessment/Plan:  End stage arthritis, right knee   The patient history, physical examination, clinical judgment of the provider and imaging studies are consistent with end stage degenerative joint disease of the right knee(s) and total knee arthroplasty is deemed medically necessary. The treatment options including medical management, injection therapy arthroscopy and arthroplasty were discussed at length. The risks and benefits of total knee arthroplasty were presented and reviewed. The risks due to aseptic loosening, infection, stiffness, patella  tracking problems, thromboembolic complications and other imponderables were discussed. The patient acknowledged the explanation, agreed to proceed with the plan and consent was signed. Patient is being admitted for inpatient treatment for surgery, pain control, PT, OT, prophylactic antibiotics, VTE prophylaxis, progressive ambulation and ADL's and discharge planning. The patient is planning to be discharged home with home health services     Patient's anticipated LOS is less than 2 midnights, meeting these requirements: - Younger than 68 - Lives within 1 hour of care - Has a competent adult at home to recover with post-op recover - NO history of  - Chronic pain requiring opiods  - Diabetes  - Coronary Artery Disease  - Heart failure  -  Heart attack  - Stroke  - DVT/VTE  - Cardiac arrhythmia  - Respiratory Failure/COPD  - Renal failure  - Anemia  - Advanced Liver disease

## 2023-05-10 NOTE — Brief Op Note (Signed)
   05/10/2023  10:34 AM  PATIENT:  Curley Spice  68 y.o. male  PRE-OPERATIVE DIAGNOSIS:  right knee osteoarthrits  POST-OPERATIVE DIAGNOSIS:  right knee osteoarthrits  PROCEDURE:  Procedure(s): RIGHT TOTAL KNEE ARTHROPLASTY  SURGEON:  Surgeon(s): Cammy Copa, MD  ASSISTANT: magnant pa  ANESTHESIA:   spinal  EBL: 50 ml    Total I/O In: 1500 [I.V.:1500] Out: 1050 [Urine:1000; Blood:50]  BLOOD ADMINISTERED: none  DRAINS: none   LOCAL MEDICATIONS USED:  marcaine morphine clonidine  SPECIMEN:  No Specimen  COUNTS:  YES  TOURNIQUET:   Total Tourniquet Time Documented: Thigh (Right) - 80 minutes Total: Thigh (Right) - 80 minutes   DICTATION: .Other Dictation: Dictation Number [id number]  PLAN OF CARE: Admit for overnight observation  PATIENT DISPOSITION:  PACU - hemodynamically stable

## 2023-05-10 NOTE — Progress Notes (Signed)
Orthopedic Tech Progress Note Patient Details:  Pedro Castaneda 18-Jan-1956 [id number]  Came to apply CPM but patient was in so much pain, asked if I could come back later. CPM is in the room   Patient ID: Pedro Castaneda, male   DOB: 1955/09/27, 68 y.o.   MRN: [id number]  Donald Pore 05/10/2023, 3:02 PM

## 2023-05-10 NOTE — Evaluation (Addendum)
Physical Therapy Evaluation Patient Details Name: Pedro Castaneda MRN: [id number] DOB: January 04, 1956 Today's Date: 05/10/2023  History of Present Illness  68 y.o. male presents to [facility] hospital on 05/10/2023 for elective R TKA. PMH includes pulmonary nodule, appendicitis.  Clinical Impression  Pt presents to PT with deficits in functional mobility, gait, balance, endurance, strength, sensation. Pt is able to transfer and ambulate with support of RW and knee immobilizer for short household distances, limited by pain. Pt has poor activation of R quads, hamstrings and hip musculature after surgery, PT provides education on TKR exercise protocol in an effort to improve muscle activation as the nerve block continues to wear off. Pt will benefit from frequent mobilization in an effort to restore independence.         If plan is discharge home, recommend the following: A little help with walking and/or transfers;A lot of help with bathing/dressing/bathroom;Assist for transportation;Help with stairs or ramp for entrance;Assistance with cooking/housework   Can travel by private vehicle        Equipment Recommendations None recommended by PT  Recommendations for Other Services       Functional Status Assessment Patient has had a recent decline in their functional status and demonstrates the ability to make significant improvements in function in a reasonable and predictable amount of time.     Precautions / Restrictions Precautions Precautions: Fall;Knee Precaution Booklet Issued: Yes (comment) Required Braces or Orthoses: Knee Immobilizer - Right Knee Immobilizer - Right: On at all times Restrictions Weight Bearing Restrictions Per Provider Order: Yes RLE Weight Bearing Per Provider Order: Weight bearing as tolerated      Mobility  Bed Mobility Overal bed mobility: Needs Assistance Bed Mobility: Supine to Sit, Sit to Supine     Supine to sit: Min assist Sit to supine: Min assist   General  bed mobility comments: assist for RLE management    Transfers Overall transfer level: Needs assistance Equipment used: Rolling walker (2 wheels) Transfers: Sit to/from Stand Sit to Stand: Contact guard assist                Ambulation/Gait Ambulation/Gait assistance: Contact guard assist Gait Distance (Feet): 60 Feet Assistive device: Rolling walker (2 wheels) Gait Pattern/deviations: Step-to pattern Gait velocity: reduced Gait velocity interpretation: <1.8 ft/sec, indicate of risk for recurrent falls   General Gait Details: slowed step-to gait, reduced stance time on RLE  Stairs            Wheelchair Mobility     Tilt Bed    Modified Rankin (Stroke Patients Only)       Balance Overall balance assessment: Needs assistance Sitting-balance support: No upper extremity supported, Feet supported Sitting balance-Leahy Scale: Good     Standing balance support: Bilateral upper extremity supported, Reliant on assistive device for balance Standing balance-Leahy Scale: Poor                               Pertinent Vitals/Pain Pain Assessment Pain Assessment: 0-10 Pain Score: 8  Pain Location: R knee Pain Descriptors / Indicators: Aching Pain Intervention(s): Patient requesting pain meds-RN notified    Home Living Family/patient expects to be discharged to:: Private residence Living Arrangements: Spouse/significant other Available Help at Discharge: Family;Available 24 hours/day Type of Home: House Home Access: Stairs to enter Entrance Stairs-Rails: Right Entrance Stairs-Number of Steps: 2   Home Layout: One level Home Equipment: Agricultural consultant (2 wheels);Shower seat;BSC/3in1;Cane - single point  Prior Function Prior Level of Function : Independent/Modified Independent;Driving;Working/employed             Mobility Comments: ambulatory without DME       Extremity/Trunk Assessment   Upper Extremity Assessment Upper Extremity  Assessment: Overall WFL for tasks assessed    Lower Extremity Assessment Lower Extremity Assessment: RLE deficits/detail RLE Deficits / Details: pt with 2/5 knee extension/flexion, 3/5 hip abduction.adduction, 4-/5 PF/DF RLE Sensation: decreased light touch (pt reports numbness at proximal thigh but full sensation distally)    Cervical / Trunk Assessment Cervical / Trunk Assessment: Normal  Communication   Communication Communication: No apparent difficulties Cueing Techniques: Verbal cues  Cognition Arousal: Alert Behavior During Therapy: WFL for tasks assessed/performed Overall Cognitive Status: Within Functional Limits for tasks assessed                                          General Comments General comments (skin integrity, edema, etc.): VSS on RA    Exercises     Assessment/Plan    PT Assessment Patient needs continued PT services  PT Problem List Decreased strength;Decreased range of motion;Decreased activity tolerance;Decreased balance;Decreased mobility;Decreased knowledge of use of DME;Impaired sensation;Pain       PT Treatment Interventions DME instruction;Gait training;Stair training;Functional mobility training;Therapeutic activities;Therapeutic exercise;Balance training;Neuromuscular re-education;Patient/family education    PT Goals (Current goals can be found in the Care Plan section)  Acute Rehab PT Goals Patient Stated Goal: to return to independence PT Goal Formulation: With patient Time For Goal Achievement: 05/14/23 Potential to Achieve Goals: Good    Frequency Min 1X/week     Co-evaluation               AM-PAC PT "6 Clicks" Mobility  Outcome Measure Help needed turning from your back to your side while in a flat bed without using bedrails?: A Little Help needed moving from lying on your back to sitting on the side of a flat bed without using bedrails?: A Little Help needed moving to and from a bed to a chair (including  a wheelchair)?: A Little Help needed standing up from a chair using your arms (e.g., wheelchair or bedside chair)?: A Little Help needed to walk in hospital room?: A Little Help needed climbing 3-5 steps with a railing? : A Lot 6 Click Score: 17    End of Session Equipment Utilized During Treatment: Gait belt;Right knee immobilizer Activity Tolerance: Patient tolerated treatment well Patient left: in bed;with call bell/phone within reach;with family/visitor present Nurse Communication: Mobility status PT Visit Diagnosis: Other abnormalities of gait and mobility (R26.89);Muscle weakness (generalized) (M62.81);Pain Pain - Right/Left: Right Pain - part of body: Knee    Time: 4098-1191 PT Time Calculation (min) (ACUTE ONLY): 26 min   Charges:   PT Evaluation $PT Eval Low Complexity: 1 Low   PT General Charges $$ ACUTE PT VISIT: 1 Visit         Arlyss Gandy, PT, DPT Acute Rehabilitation Office [phone]   Arlyss Gandy 05/10/2023, 3:12 PM

## 2023-05-10 NOTE — Anesthesia Procedure Notes (Signed)
Anesthesia Regional Block: Adductor canal block   Pre-Anesthetic Checklist: , timeout performed,  Correct Patient, Correct Site, Correct Laterality,  Correct Procedure, Correct Position, site marked,  Risks and benefits discussed,  Surgical consent,  Pre-op evaluation,  At surgeon's request and post-op pain management  Laterality: Right  Prep: chloraprep       Needles:  Injection technique: Single-shot  Needle Type: Echogenic Stimulator Needle     Needle Length: 5cm  Needle Gauge: 22     Additional Needles:   Procedures:,,,, ultrasound used (permanent image in chart),,    Narrative:  Start time: 05/10/2023 7:10 AM End time: 05/10/2023 7:15 AM Injection made incrementally with aspirations every 5 mL.  Performed by: Personally  Anesthesiologist: Bethena Midget, MD  Additional Notes: Functioning IV was confirmed and monitors were applied.  A 50mm 22ga Arrow echogenic stimulator needle was used. Sterile prep and drape,hand hygiene and sterile gloves were used. Ultrasound guidance: relevant anatomy identified, needle position confirmed, local anesthetic spread visualized around nerve(s)., vascular puncture avoided.  Image printed for medical record. Negative aspiration and negative test dose prior to incremental administration of local anesthetic. The patient tolerated the procedure well.

## 2023-05-10 NOTE — Op Note (Signed)
NAME: Pedro Castaneda, Pedro Castaneda MEDICAL RECORD NO: [id number] ACCOUNT NO: [id number] DATE OF BIRTH: 1955-04-09 FACILITY: [facility] LOCATION: [facility] PHYSICIAN: Graylin Shiver. August Saucer, MD  Operative Report   DATE OF PROCEDURE: 05/10/2023  PREOPERATIVE DIAGNOSIS:  Right knee arthritis.  POSTOPERATIVE DIAGNOSIS:  Right knee arthritis.  PROCEDURE:  Right total knee replacement using press-fit persona cruciate retaining size 9 standard femur, size G spiked press-fit tibia, 38 mm press-fit patella three pegs, and 12 mm medial congruent bearing.  SURGEON:  Graylin Shiver. August Saucer, MD.  ASSISTANT:  Karenann Cai, PA.  INDICATIONS: This is a 68 year old patient with end-stage right knee arthritis who presents for operative management after explanation of risks and benefits.  DESCRIPTION OF PROCEDURE:  The patient was brought to the operating room where spinal anesthetic was induced.  Preoperative antibiotics administered.  Timeout was called.  The right leg was then prescrubbed with alcohol and Betadine and allowed to air  dry. Prepped with DuraPrep solution, draped in a sterile manner.  Pedro Castaneda was used to cover the operative field.  The patient had about a 10-degree flexion contracture and bent to about 115.  Collaterals were stable.  After calling timeout, the leg was  elevated and exsanguinated with the Esmarch wrap.  Tourniquet was inflated.  Anterior approach to the knee was made.  IrriSept solution was utilized.  Median parapatellar approach was made and marked with a #1 Vicryl suture.  IrriSept solution was  utilized in the joint.  The fat pad was partially excised.  The lateral patellofemoral ligament was released.  Lateral meniscal tear was visualized with flap in the lateral gutter.  This was removed.  Soft tissue was removed from the anterior distal  femur.  Medial soft tissue dissection was then performed proportionate to the patient's moderate varus deformity.  Knee was flexed.  Anterior horn lateral meniscus further  released.  ACL released.  Osteophytes removed.  Posterior retractor placed.   Intramedullary alignment was then used to make a cut of 2 mm off the most affected medial tibial plateau.  The 2 mm cut was not really going to get down to decent bone for press-fit.  That was increased 2 more mm for the initial cut.  This was done with  the posterior neurovascular structures and collateral ligaments protected.  Bone quality was excellent.  Femur was then cut in 5 degrees of valgus with the intramedullary cutting guide and a 12-mm cut was made based on the patient's preop flexion  deformity.  We later revised the tibial cut two more millimeters in order to get down below the sclerotic bone on the medial side.  This allowed a 10-mm spacer to fit well within that extension gap.  Femur was sized to a size 9.  The femoral cutting  guide was then placed in 3 degrees of external rotation, which gave symmetric flexion and extension gaps.  Anterior, posterior, and chamfer cuts were made.  Tibia was then placed to align with the medial third of the tibial tubercle.  Tibial tray was  placed into position and we did trial reduction with a 9 femur and a 10 and 12 poly spacer.  Patella was then cut down from 25 to 15 mm and a 3 peg patellar trial button was placed.  With trial components in position including the 10 mm spacer. With the  10 mm spacer the patient had full extension, full flexion, no lift-off, and excellent tracking of the patella with good stability to varus and valgus stress at 0, 30,  and 90 degrees.  The preparations were then completed on the femur and the tibia.  At  this time, 3 liters of pulsatile irrigation were utilized in the knee joint.  The capsule was then anesthetized using a combination of Marcaine, saline, and Exparel.  Next, we left the TXA sponge and IrriSept solution in the knee joint for 3 minutes.   These were removed.  Vancomycin powder was then placed into the IrriSept irrigated tibial canal  and we tapped in the tibial component followed by the femoral component and we placed a 12-mm spacer because that was more stable in extension and 30 degrees  of flexion.  Patella was then placed.  With true components in position, the patient achieved full extension, full flexion, no lift-off, and excellent patellar tracking.  Knee was stable to varus and valgus stress at 0, 30, and 90 degrees with the 12  spacer.  This was an improvement over the 10 spacer, which was placed with the true components.  After the true 12 spacer was placed, the tourniquet was released.  Bleeding points were encountered controlled using electrocautery.  Three liters of pouring  irrigation was then utilized and the knee joint was then closed over a bolster using a #1 Vicryl suture.  Prior to final closure, we did irrigate out the knee joint again with IrriSept solution and placed in vancomycin powder.  Complete closure was then  performed.  The knee joint was then injected with Marcaine, morphine, and clonidine for added postop pain relief.  Further closure was performed using 0 Vicryl suture, 2-0 Vicryl suture, and 3-0 Monocryl with Steri-Strips and Aquacel dressing applied  along with Ace wrap and knee immobilizer.  Pedro Castaneda's assistance was required at all times for retraction, opening, closing, mobilization of tissue.  His assistance was of medical necessity.   PUS D: 05/10/2023 10:41:09 am T: 05/10/2023 11:22:00 am  JOB: [id number]/ [id number]

## 2023-05-10 NOTE — Transfer of Care (Signed)
Immediate Anesthesia Transfer of Care Note  Patient: Pedro Castaneda  Procedure(s) Performed: RIGHT TOTAL KNEE ARTHROPLASTY (Right: Knee)  Patient Location: PACU  Anesthesia Type:MAC  Level of Consciousness: awake, alert , and oriented  Airway & Oxygen Therapy: Patient Spontanous Breathing and Patient connected to face mask oxygen  Post-op Assessment: Report given to RN and Post -op Vital signs reviewed and stable  Post vital signs: Reviewed and stable  Last Vitals:  Vitals Value Taken Time  BP 125/70 05/10/23 1013  Temp    Pulse 65 05/10/23 1015  Resp 14 05/10/23 1015  SpO2 98 % 05/10/23 1015  Vitals shown include unfiled device data.  Last Pain:  Vitals:   05/10/23 0608  TempSrc:   PainSc: 0-No pain         Complications: No notable events documented.

## 2023-05-10 NOTE — Anesthesia Postprocedure Evaluation (Signed)
Anesthesia Post Note  Patient: Pedro Castaneda  Procedure(s) Performed: RIGHT TOTAL KNEE ARTHROPLASTY (Right: Knee)     Patient location during evaluation: PACU Anesthesia Type: Regional, Spinal and MAC Level of consciousness: awake and alert Pain management: pain level controlled Vital Signs Assessment: post-procedure vital signs reviewed and stable Respiratory status: spontaneous breathing and respiratory function stable Cardiovascular status: blood pressure returned to baseline and stable Postop Assessment: spinal receding Anesthetic complications: no   No notable events documented.  Last Vitals:  Vitals:   05/10/23 1100 05/10/23 1115  BP: 116/88 104/65  Pulse: 67 (!) 55  Resp: 17 18  Temp:    SpO2: 99% 96%    Last Pain:  Vitals:   05/10/23 1035  TempSrc:   PainSc: 8         RLE Motor Response: Responds to commands (05/10/23 1115) RLE Sensation: Tingling (05/10/23 1115) L Sensory Level: S1-Sole of foot, small toes (05/10/23 1115) R Sensory Level: S1-Sole of foot, small toes (05/10/23 1115)  Mariateresa Batra

## 2023-05-10 NOTE — Anesthesia Procedure Notes (Signed)
Spinal  Patient location during procedure: OR Start time: 05/10/2023 7:30 AM End time: 05/10/2023 7:33 AM Reason for block: surgical anesthesia Staffing Anesthesiologist: Bethena Midget, MD Performed by: Bethena Midget, MD Authorized by: Bethena Midget, MD   Preanesthetic Checklist Completed: patient identified, IV checked, site marked, risks and benefits discussed, surgical consent, monitors and equipment checked, pre-op evaluation and timeout performed Spinal Block Patient position: sitting Prep: DuraPrep Patient monitoring: heart rate, cardiac monitor, continuous pulse ox and blood pressure Approach: midline Location: L4-5 Injection technique: single-shot Needle Needle type: Sprotte  Needle gauge: 24 G Needle length: 9 cm Assessment Sensory level: T4 Events: CSF return

## 2023-05-11 ENCOUNTER — Encounter (HOSPITAL_COMMUNITY): Payer: Self-pay | Admitting: Orthopedic Surgery

## 2023-05-11 ENCOUNTER — Telehealth: Payer: Self-pay

## 2023-05-11 DIAGNOSIS — M1711 Unilateral primary osteoarthritis, right knee: Secondary | ICD-10-CM | POA: Diagnosis present

## 2023-05-11 DIAGNOSIS — Z96659 Presence of unspecified artificial knee joint: Secondary | ICD-10-CM

## 2023-05-11 DIAGNOSIS — M25561 Pain in right knee: Secondary | ICD-10-CM | POA: Diagnosis present

## 2023-05-11 DIAGNOSIS — Z87891 Personal history of nicotine dependence: Secondary | ICD-10-CM | POA: Diagnosis not present

## 2023-05-11 DIAGNOSIS — G8929 Other chronic pain: Secondary | ICD-10-CM | POA: Diagnosis present

## 2023-05-11 DIAGNOSIS — I2510 Atherosclerotic heart disease of native coronary artery without angina pectoris: Secondary | ICD-10-CM | POA: Diagnosis present

## 2023-05-11 DIAGNOSIS — S83281A Other tear of lateral meniscus, current injury, right knee, initial encounter: Secondary | ICD-10-CM | POA: Diagnosis present

## 2023-05-11 DIAGNOSIS — X58XXXA Exposure to other specified factors, initial encounter: Secondary | ICD-10-CM | POA: Diagnosis present

## 2023-05-11 DIAGNOSIS — Z8249 Family history of ischemic heart disease and other diseases of the circulatory system: Secondary | ICD-10-CM | POA: Diagnosis not present

## 2023-05-11 DIAGNOSIS — J449 Chronic obstructive pulmonary disease, unspecified: Secondary | ICD-10-CM | POA: Diagnosis present

## 2023-05-11 MED ORDER — HYDROMORPHONE HCL 1 MG/ML IJ SOLN
0.5000 mg | INTRAMUSCULAR | Status: DC
Start: 2023-05-11 — End: 2023-05-12
  Administered 2023-05-11 (×2): 0.5 mg via INTRAVENOUS
  Filled 2023-05-11 (×3): qty 0.5

## 2023-05-11 MED ORDER — GABAPENTIN 100 MG PO CAPS
200.0000 mg | ORAL_CAPSULE | Freq: Three times a day (TID) | ORAL | Status: DC
  Administered 2023-05-11 – 2023-05-12 (×3): 200 mg via ORAL
  Filled 2023-05-11 (×3): qty 2

## 2023-05-11 MED ORDER — METHOCARBAMOL 1000 MG/10ML IJ SOLN
500.0000 mg | Freq: Three times a day (TID) | INTRAMUSCULAR | Status: DC
  Filled 2023-05-11 (×2): qty 10

## 2023-05-11 MED ORDER — ACETAMINOPHEN 325 MG PO TABS
325.0000 mg | ORAL_TABLET | Freq: Four times a day (QID) | ORAL | Status: DC
  Administered 2023-05-11 – 2023-05-12 (×4): 325 mg via ORAL
  Filled 2023-05-11 (×4): qty 1

## 2023-05-11 MED ORDER — NAPROXEN 250 MG PO TABS
375.0000 mg | ORAL_TABLET | Freq: Two times a day (BID) | ORAL | Status: DC
  Administered 2023-05-11 – 2023-05-12 (×2): 375 mg via ORAL
  Filled 2023-05-11 (×2): qty 2

## 2023-05-11 MED ORDER — OXYCODONE HCL 5 MG PO TABS
10.0000 mg | ORAL_TABLET | ORAL | Status: DC
Start: 2023-05-11 — End: 2023-05-12
  Administered 2023-05-11 – 2023-05-12 (×7): 10 mg via ORAL
  Filled 2023-05-11 (×7): qty 2

## 2023-05-11 MED ORDER — METHOCARBAMOL 500 MG PO TABS
500.0000 mg | ORAL_TABLET | Freq: Three times a day (TID) | ORAL | Status: DC
  Administered 2023-05-11 – 2023-05-12 (×3): 500 mg via ORAL
  Filled 2023-05-11 (×3): qty 1

## 2023-05-11 NOTE — Telephone Encounter (Signed)
Hospital calling saying they are needing an inpatient order for this patient

## 2023-05-11 NOTE — Care Management Obs Status (Signed)
MEDICARE OBSERVATION STATUS NOTIFICATION   Patient Details  Name: Pedro Castaneda MRN: [id number] Date of Birth: [birth year]   Medicare Observation Status Notification Given:  Yes    Epifanio Lesches, RN 05/11/2023, 11:47 AM

## 2023-05-11 NOTE — Plan of Care (Signed)

## 2023-05-11 NOTE — Progress Notes (Signed)
 Physical Therapy Treatment Patient Details Name: Pedro Castaneda MRN: [id number] DOB: Oct 10, 1955 Today's Date: 05/11/2023   History of Present Illness 68 y.o. male presents to [facility] hospital on 05/10/2023 for elective R TKA. PMH includes pulmonary nodule, appendicitis.    PT Comments  Pt received in supine, agreeable to therapy session after premedication by RN and spouse present and supportive. Pt remains limited due to increased c/o pain this date despite premedication and also limited due to elevated BP in seated/standing postures and c/o nausea with RLE ROM exercises due to pain. PTA heavily encouraging use of ice, frequent ROM, and increased PO intake as pt reports not eating well/drinking much water  in AM. Pt continues to demonstrate RLE quad lag with attempts at [facility] exercise and unable to perform short arc quad or straight leg raise without significant active assist from PTA. Pt needing up to minA for transfers/gait and chair follow for safety due to pain, defer longer gait trial in hallway due to pt very elevated BP and pain/nausea in standing, RN notified of pt sx. Pt reports night shift did not place him in CPM so PTA reinforced with nursing staff/charge RN that ortho techs are available in hospital 24/7 and can assist staff to place CPM on patient if staff is unfamiliar with device. Pt agreeable to sit up in chair at end of session in attempt to build tolerance for OOB activity and agreeable to try eating more at mealtime which will hopefully build tolerance for more activity in PM. Pt continues to benefit from PT services to progress toward functional mobility goals, anticipate pt will need at least 1-2 more sessions to work on gait/stair training and ensure adequate pain control prior to DC home. Orthostatic Sitting (both taken on his LUE, therapist holding arm at rest at chest height for accurate reading)  BP- Sitting (!) 190/95 (nausea but appears related to pain as he endorsed this prior to OOB)   Pulse- Sitting 83  Orthostatic Standing at 0 minutes  BP- Standing at 0 minutes 186/89 (still with c/o severe RLE pain and nausea)  Pulse- Standing at 0 minutes 91     If plan is discharge home, recommend the following: A little help with walking and/or transfers;A lot of help with bathing/dressing/bathroom;Assist for transportation;Help with stairs or ramp for entrance;Assistance with cooking/housework   Can travel by private vehicle        Equipment Recommendations  None recommended by PT    Recommendations for Other Services       Precautions / Restrictions Precautions Precautions: Fall;Knee Precaution Booklet Issued: Yes (comment) Precaution Comments: elevated BP, nausea Required Braces or Orthoses: Knee Immobilizer - Right Knee Immobilizer - Right: On at all times Knee Immobilizer - Left: On except when in CPM Restrictions Weight Bearing Restrictions Per Provider Order: Yes RLE Weight Bearing Per Provider Order: Weight bearing as tolerated     Mobility  Bed Mobility Overal bed mobility: Needs Assistance Bed Mobility: Supine to Sit     Supine to sit: Min assist     General bed mobility comments: assist for RLE management, pt able to use gait belt as leg lifter/to slow RLE descent but still needing minA for trunk stability initially due to posterior lean and severe pain, as pt attempting to use arms to lower RLE.    Transfers Overall transfer level: Needs assistance Equipment used: Rolling walker (2 wheels) Transfers: Sit to/from Stand Sit to Stand: Contact guard assist, From elevated surface, Min assist  General transfer comment: from very elevated bed surface>RW, CGA to rise and minA for stand>sit due to increased pain and for pt safety, dense cues for not holding breath and safe UE/RLE placement    Ambulation/Gait Ambulation/Gait assistance: Min assist, +2 safety/equipment Gait Distance (Feet): 5 Feet Assistive device: Rolling walker (2  wheels) Gait Pattern/deviations: Step-to pattern, Decreased dorsiflexion - right, Shuffle, Trunk flexed Gait velocity: reduced     General Gait Details: slowed step-to gait, reduced stance time on RLE, cues for improved body mechanics and RW use, distance limited due to pt c/o severe pain and noted elevated BP in standing, pt agreeable to get in chair so spouse assisting for safety with chair follow to sit and rest.   Stairs             Wheelchair Mobility     Tilt Bed    Modified Rankin (Stroke Patients Only)       Balance Overall balance assessment: Needs assistance Sitting-balance support: No upper extremity supported, Feet supported Sitting balance-Leahy Scale: Good     Standing balance support: Bilateral upper extremity supported, Reliant on assistive device for balance Standing balance-Leahy Scale: Poor Standing balance comment: heavy reliance on RW due to pain, noted elevated BP so defer prolonged standing for pt safety                            Cognition Arousal: Alert Behavior During Therapy: WFL for tasks assessed/performed Overall Cognitive Status: Within Functional Limits for tasks assessed                                          Exercises Total Joint Exercises Ankle Circles/Pumps: AROM, Right, 10 reps, Supine Quad Sets: AROM, Right, 10 reps, Supine (unable to reach full TKE) Short Arc Quad: AAROM, Right, 5 reps, Supine Heel Slides: AAROM, Right, 10 reps, Supine (pain/edema limiting his tolerance for flexion) Hip ABduction/ADduction: AAROM, Right, 10 reps, Supine (AA mostly due to fabric of linens/ace wrap not allowing for ease of slide, pt using gait belt to keep RLE elevated but then not correctly activating hip abductors, so AA for improved technique along with vcs) Straight Leg Raises: AAROM, Right, 5 reps, Supine (pt unable to lift RLE >1 inch off bed surface without AA, and noted R quad lag >10*) Goniometric ROM: R  knee flexion AAROM 15 deg to 68 deg in supine    General Comments General comments (skin integrity, edema, etc.): see BP in comments above      Pertinent Vitals/Pain Pain Assessment Pain Assessment: Faces Faces Pain Scale: Hurts worst Pain Location: R knee, most severe with RLE ROM and sitting EOB. Pain Descriptors / Indicators: Aching, Grimacing, Discomfort, Throbbing Pain Intervention(s): Monitored during session, Limited activity within patient's tolerance, Premedicated before session, Repositioned, Patient requesting pain meds-RN notified, Ice applied (ice-man re-donned in chair; pt premedicated ~1hr prior to session and he reports despite this, pain is still very severe and not well controlled; +nausea but no vomiting, using alcohol pad to smell helped somewhat with nausea sx. Noted elevated BP also)     PT Goals (current goals can now be found in the care plan section) Acute Rehab PT Goals Patient Stated Goal: to return to independence PT Goal Formulation: With patient Time For Goal Achievement: 05/14/23 Progress towards PT goals: Progressing toward goals    Frequency  Min 1X/week      PT Plan         AM-PAC PT 6 Clicks Mobility   Outcome Measure  Help needed turning from your back to your side while in a flat bed without using bedrails?: A Little Help needed moving from lying on your back to sitting on the side of a flat bed without using bedrails?: A Little Help needed moving to and from a bed to a chair (including a wheelchair)?: A Little Help needed standing up from a chair using your arms (e.g., wheelchair or bedside chair)?: A Little Help needed to walk in hospital room?: A Little Help needed climbing 3-5 steps with a railing? : A Lot 6 Click Score: 17    End of Session Equipment Utilized During Treatment: Gait belt;Right knee immobilizer Activity Tolerance: Patient limited by pain;Treatment limited secondary to medical complications (Comment);Other  (comment) (elevated BP with sitting/standing and pt c/o severe pain/nausea so defer longer gait trial) Patient left: with call bell/phone within reach;with family/visitor present;in chair (spouse will remain in room with him) Nurse Communication: Mobility status;Patient requests pain meds;Precautions;Other (comment) (# for ortho tech written on his board, pt wants CPM placed after sitting up for lunch, he plans return to bed for CPM) PT Visit Diagnosis: Other abnormalities of gait and mobility (R26.89);Muscle weakness (generalized) (M62.81);Pain Pain - Right/Left: Right Pain - part of body: Knee     Time: 1016-1100 PT Time Calculation (min) (ACUTE ONLY): 44 min  Charges:    $Therapeutic Exercise: 23-37 mins $Therapeutic Activity: 8-22 mins PT General Charges $$ ACUTE PT VISIT: 1 Visit                     Jenie Parish P., PTA Acute Rehabilitation Services Secure Chat Preferred 9a-5:30pm Office: [phone]    Connell HERO [facility] 05/11/2023, 11:28 AM

## 2023-05-11 NOTE — Progress Notes (Signed)
  Subjective: Patient stable.  Having significantly more pain today and yesterday.  Has been able to ambulate today.   Objective: Vital signs in last 24 hours: Temp:  [97.5 F (36.4 C)-98.5 F (36.9 C)] 98.5 F (36.9 C) (02/04 0924) Pulse Rate:  [7-84] 74 (02/04 0924) Resp:  [12-18] 18 (02/04 0600) BP: (108-190)/(55-95) 190/95 (02/04 1043) SpO2:  [94 %-99 %] 99 % (02/04 0924)  Intake/Output from previous day: 02/03 0701 - 02/04 0700 In: 1500 [I.V.:1500] Out: 1800 [Urine:1750; Blood:50] Intake/Output this shift: No intake/output data recorded.  Exam:  Sensation intact distally Dorsiflexion/Plantar flexion intact  Labs: No results for input(s): HGB in the last 72 hours. No results for input(s): WBC, RBC, HCT, PLT in the last 72 hours. No results for input(s): NA, K, CL, CO2, BUN, CREATININE, GLUCOSE, CALCIUM in the last 72 hours. No results for input(s): LABPT, INR in the last 72 hours.  Assessment/Plan: Plan at this time is to adjust medication to put her on a more scheduled basis.  Will add Tylenol  Neurontin  and other medications to try to help the pain become less.  Continue with IV pain medicine for breakthrough pain for now.  We will recheck later today.   G Scott Joseandres Mazer 05/11/2023, 11:40 AM

## 2023-05-11 NOTE — Progress Notes (Signed)
 Person requiring IV pain medicine today. Do not anticipate that to be the case tomorrow Currently his pain is under better control with scheduled medication Plan to change scheduled oxycodone  from every 3 hours to every 3 to 4 hours tonight per patient preference Plan reevaluation in morning and therapy in the morning with likely discharge home in early afternoon

## 2023-05-11 NOTE — Telephone Encounter (Signed)
 Ordering

## 2023-05-11 NOTE — Progress Notes (Addendum)
 Physical Therapy Treatment Patient Details Name: Pedro Castaneda MRN: [id number] DOB: 04-Feb-1956 Today's Date: 05/11/2023   History of Present Illness 68 y.o. male presents to [facility] hospital on 05/10/2023 for elective R TKA. PMH includes pulmonary nodule, appendicitis.    PT Comments  Pt received in supine, agreeable to therapy session after using CPM and getting pain meds, pt reporting pain more moderate in afternoon compared to severe pain in AM. Pt needing up to minA for bed mobility and CGA for transfers/gait with chair follow for safety. Pt without significant c/o nausea or dizziness in PM, but does demonstrate elevated BP in supine/at rest prior to OOB mobility. BP 187/82 he reports typically his BP is WNL and he does not take any BP meds at baseline. Pt with good effort for household distance gait trial however limited due to pain/fatigue and not yet ready to perform stairs to simulate home entry. Pt continues to benefit from PT services to progress toward functional mobility goals, anticipate pt will need 1-2 more sessions to practice stair negotiation and progress gait tolerance prior to return home and ensure his pain is controlled, pt reports no BM yet since surgery.     If plan is discharge home, recommend the following: A little help with walking and/or transfers;A lot of help with bathing/dressing/bathroom;Assist for transportation;Help with stairs or ramp for entrance;Assistance with cooking/housework   Can travel by private vehicle        Equipment Recommendations  None recommended by PT    Recommendations for Other Services       Precautions / Restrictions Precautions Precautions: Fall;Knee Precaution Booklet Issued: Yes (comment) Precaution Comments: elevated BP, nausea Required Braces or Orthoses: Knee Immobilizer - Right Knee Immobilizer - Right: On at all times Knee Immobilizer - Left: On except when in CPM Restrictions Weight Bearing Restrictions Per Provider Order:  Yes RLE Weight Bearing Per Provider Order: Weight bearing as tolerated     Mobility  Bed Mobility Overal bed mobility: Needs Assistance Bed Mobility: Supine to Sit     Supine to sit: Min assist, HOB elevated, Used rails Sit to supine: Contact guard assist, HOB elevated, Used rails   General bed mobility comments: Pt able to use gait belt as leg lifter/to slow RLE descent but still needing light minA for trunk stability and advancing hips forward from elevated HOB posture, pt using bed rail; (does not use bed rails at home)    Transfers Overall transfer level: Needs assistance Equipment used: Rolling walker (2 wheels) Transfers: Sit to/from Stand Sit to Stand: Contact guard assist, From elevated surface           General transfer comment: from bed<>RW, cues for safe UE/LE placement and increased time to perform    Ambulation/Gait Ambulation/Gait assistance: +2 safety/equipment, Contact guard assist Gait Distance (Feet): 85 Feet Assistive device: Rolling walker (2 wheels) Gait Pattern/deviations: Step-to pattern, Decreased dorsiflexion - right, Shuffle, Trunk flexed Gait velocity: reduced     General Gait Details: slowed step-to gait, reduced stance time on RLE, cues for improved body mechanics and RW use, pt with KI donned throughout and reports pain more tolerable after MD added more pain meds for him on schedule. Chair follow for safety but pt did not need seated break to achieve this distance.   Stairs Stairs:  (pt defers due to pain/fatigue and noted elevated BP (no c/o headache))           Wheelchair Mobility     Tilt Bed  Modified Rankin (Stroke Patients Only)       Balance Overall balance assessment: Needs assistance Sitting-balance support: No upper extremity supported, Feet supported Sitting balance-Leahy Scale: Good     Standing balance support: Bilateral upper extremity supported, Reliant on assistive device for balance, During functional  activity Standing balance-Leahy Scale: Poor (Fair with single UE support/static standing) Standing balance comment: heavy reliance on RW due to pain                            Cognition Arousal: Alert Behavior During Therapy: WFL for tasks assessed/performed Overall Cognitive Status: Within Functional Limits for tasks assessed                                          Exercises Total Joint Exercises Ankle Circles/Pumps: AROM, Right, 10 reps, Supine Quad Sets: AROM, Right, 5 reps, Supine Short Arc Quad: AAROM, Right, 5 reps, Supine Heel Slides: AAROM, Right, 10 reps, Supine (pain/edema limiting his tolerance for flexion) Hip ABduction/ADduction: AAROM, Right, 5 reps, Supine Straight Leg Raises: AAROM, Right, 5 reps, Supine (pt unable to lift RLE >1 inch off bed surface without AA, and noted R quad lag >10*) Goniometric ROM: R knee flexion AAROM 15 deg to 68 deg in supine    General Comments General comments (skin integrity, edema, etc.): BP 187/82 supine prior to OOB      Pertinent Vitals/Pain Pain Assessment Pain Assessment: 0-10 Pain Score: 6  Faces Pain Scale: Hurts worst Pain Location: R knee, most severe with RLE  knee ROM. Pain Descriptors / Indicators: Aching, Discomfort, Grimacing Pain Intervention(s): Limited activity within patient's tolerance, Premedicated before session, Monitored during session, Repositioned, Ice applied    Home Living                          Prior Function            PT Goals (current goals can now be found in the care plan section) Acute Rehab PT Goals Patient Stated Goal: to return to independence PT Goal Formulation: With patient Time For Goal Achievement: 05/14/23 Progress towards PT goals: Progressing toward goals    Frequency    Min 1X/week      PT Plan      Co-evaluation              AM-PAC PT 6 Clicks Mobility   Outcome Measure  Help needed turning from your back to your  side while in a flat bed without using bedrails?: A Little Help needed moving from lying on your back to sitting on the side of a flat bed without using bedrails?: A Little Help needed moving to and from a bed to a chair (including a wheelchair)?: A Little Help needed standing up from a chair using your arms (e.g., wheelchair or bedside chair)?: A Little Help needed to walk in hospital room?: A Little Help needed climbing 3-5 steps with a railing? : Total 6 Click Score: 16    End of Session Equipment Utilized During Treatment: Gait belt;Right knee immobilizer Activity Tolerance: Patient limited by pain;Treatment limited secondary to medical complications (Comment);Other (comment) (elevated BP with sitting/standing and pt c/o severe pain/nausea so defer longer gait trial) Patient left: with call bell/phone within reach;with family/visitor present;in chair (spouse will remain in room with him) Nurse  Communication: Mobility status;Patient requests pain meds;Precautions;Other (comment) (# for ortho tech written on his board, pt wants CPM placed after sitting up for lunch, he plans return to bed for CPM) PT Visit Diagnosis: Other abnormalities of gait and mobility (R26.89);Muscle weakness (generalized) (M62.81);Pain Pain - Right/Left: Right Pain - part of body: Knee     Time: 8676-8648 PT Time Calculation (min) (ACUTE ONLY): 28 min  Charges:    $Gait Training: 8-22 mins $Therapeutic Activity: 8-22 mins PT General Charges $$ ACUTE PT VISIT: 1 Visit                     Pedro Million P., PTA Acute Rehabilitation Services Secure Chat Preferred 9a-5:30pm Office: [phone]    Pedro Castaneda [facility] 05/11/2023, 2:06 PM

## 2023-05-12 MED ORDER — ASPIRIN 81 MG PO CHEW: 81.0000 mg | CHEWABLE_TABLET | Freq: Two times a day (BID) | ORAL | 0 refills | Status: AC

## 2023-05-12 MED ORDER — DOCUSATE SODIUM 100 MG PO CAPS: 100.0000 mg | ORAL_CAPSULE | Freq: Two times a day (BID) | ORAL | 0 refills | Status: DC

## 2023-05-12 MED ORDER — OXYCODONE HCL 5 MG PO TABS: 5.0000 mg | ORAL_TABLET | ORAL | 0 refills | Status: DC | PRN

## 2023-05-12 MED ORDER — NAPROXEN 375 MG PO TABS: 375.0000 mg | ORAL_TABLET | Freq: Two times a day (BID) | ORAL | 0 refills | Status: AC

## 2023-05-12 MED ORDER — GABAPENTIN 300 MG PO CAPS: 300.0000 mg | ORAL_CAPSULE | Freq: Three times a day (TID) | ORAL | 0 refills | Status: AC

## 2023-05-12 MED ORDER — METHOCARBAMOL 500 MG PO TABS: 500.0000 mg | ORAL_TABLET | Freq: Three times a day (TID) | ORAL | 0 refills | Status: DC | PRN

## 2023-05-12 NOTE — Progress Notes (Addendum)
  Subjective: Pedro Castaneda is a 68 y.o. male s/p right TKA.  They are POD 2.  Pt's pain is moderate but controlled.  Pain control is much improved compared with yesterday.  He states I feel like the old [name] again..  Pt denies any complain of chest pain, shortness of breath, abdominal pain, calf pain.  Patient denies any fevers or chills.  Using CPM machine.  Has not ambulated yet today.  Objective: Vital signs in last 24 hours: Temp:  [97.7 F (36.5 C)-98.7 F (37.1 C)] 98.3 F (36.8 C) (02/05 0736) Pulse Rate:  [65-83] 71 (02/05 0736) Resp:  [18] 18 (02/05 0736) BP: (131-190)/(73-95) 131/73 (02/05 0736) SpO2:  [93 %-99 %] 95 % (02/05 0736)  Intake/Output from previous day: 02/04 0701 - 02/05 0700 In: -  Out: 1300 [Urine:1300] Intake/Output this shift: No intake/output data recorded.  Exam:  No gross blood or drainage overlying the dressing 2+ DP pulse Sensation intact distally in the operative foot Able to dorsiflex and plantarflex the operative foot No calf tenderness.  Negative Homans' sign. Able to perform straight leg raise   Labs: No results for input(s): HGB in the last 72 hours. No results for input(s): WBC, RBC, HCT, PLT in the last 72 hours. No results for input(s): NA, K, CL, CO2, BUN, CREATININE, GLUCOSE, CALCIUM  in the last 72 hours. No results for input(s): LABPT, INR in the last 72 hours.  Assessment/Plan: Pt is POD 2 s/p TKA.    -Plan to discharge to home likely this morning after working with physical therapy.  Has 2 stairs to get into his house.  Needs to work with PT for stair training and as long as he has no setbacks, okay to discharge home this morning.  Once he finishes physical therapy, feel free to reach out to me and I will place order for discharge.  -WBAT with a walker  -Follow-up with myself or Dr. Addie in clinic 2 weeks postoperatively    [facility] 05/12/2023, 8:19 AM

## 2023-05-12 NOTE — Progress Notes (Signed)
 Debby LITTIE Clause to be D/C'd  per MD order.  Discussed with the patient and all questions fully answered.  VSS, Skin clean, dry and intact without evidence of skin break down, no evidence of skin tears noted.  IV catheter discontinued intact. Site without signs and symptoms of complications. Dressing and pressure applied.  An After Visit Summary was printed and given to the patient. Patient received prescription.  D/c education completed with patient/family including follow up instructions, medication list, d/c activities limitations if indicated, with other d/c instructions as indicated by MD - patient able to verbalize understanding, all questions fully answered.   Patient instructed to return to ED, call 911, or call MD for any changes in condition.   Patient to be escorted via WC, and D/C home via private auto.

## 2023-05-12 NOTE — Progress Notes (Signed)
 Orthopedic Tech Progress Note Patient Details:  Pedro Castaneda [birth year] [id number]  CPM Right Knee CPM Right Knee: On Right Knee Flexion (Degrees): 50 Right Knee Extension (Degrees): 5  Post Interventions Patient Tolerated: Well  Massie BRAVO Eydie Wormley 05/12/2023, 8:58 AM

## 2023-05-12 NOTE — Plan of Care (Signed)

## 2023-05-12 NOTE — TOC Transition Note (Addendum)
 Transition of Care [facility]) - Discharge Note   Patient Details  Name: BAINE DECESARE MRN: [id number] Date of Birth: 1955/09/10  Transition of [facility]) CM/SW Contact:  Rosalva Jon Bloch, RN Phone Number: 05/12/2023, 10:25 AM   Clinical Narrative:    Patient will DC to: home Anticipated DC date: 05/12/2023 Family notified: yes Transport by: car        - s/p right TKA, 2/3 Per MD patient ready for DC today pending therapy clearance. RN, patient,  patient's wife and [facility] notified of DC. Pt states has RW, cane, CPM at home. Wife to assist with care once home. Pt without RX med concerns. Post hospital f/u noted on AVS. Wife to provide transportation to home.  RNCM will sign off for now as intervention is no longer needed. Please consult us  again if new needs arise.   Final next level of care: Home w Home Health Services Barriers to Discharge: No Barriers Identified   Patient Goals and CMS Choice            Discharge Placement                       Discharge Plan and Services Additional resources added to the After Visit Summary for                            [facility] Arranged: PT [facility] Agency: [facility] Date [facility] Agency Contacted: 05/12/23 Time HH Agency Contacted: [phone]    Social Drivers of Health (SDOH) Interventions SDOH Screenings   Food Insecurity: No Food Insecurity (05/10/2023)  Housing: Low Risk  (05/10/2023)  Transportation Needs: No Transportation Needs (05/10/2023)  Utilities: Not At Risk (05/10/2023)  Social Connections: Socially Integrated (05/10/2023)  Tobacco Use: Medium Risk (05/10/2023)     Readmission Risk Interventions     No data to display

## 2023-05-12 NOTE — Progress Notes (Signed)
 Physical Therapy Treatment Patient Details Name: Pedro Castaneda MRN: [id number] DOB: 01/01/1956 Today's Date: 05/12/2023   History of Present Illness 68 y.o. male presents to [facility] hospital on 05/10/2023 for elective R TKA. PMH includes pulmonary nodule, appendicitis.    PT Comments  Pt received in supine, agreeable to therapy session and reporting improvement in symptoms of pain this AM after better pain mgmt overnight. Pt already used CPM and reports he tolerated 50* flexion well, pt still with 10-15 deg flexion during quad set and encouraged to continue aggressive knee flexion/extension post-op and to follow HEP as he reports not doing exercises prior to therapy session as suggested previous date. Pt needing less assist to perform transfers, bed mobility and gait this session, pt given chair follow for safety but did not need seated break to perform longer household distance gait trial and stair trial and returned to supine at end of session so therapist can assist in placement of iceman cold therapy. BP remains slightly elevated while pt seated/with activity (see below) and HR max 117 bpm with exertion, SpO2 WFL on RA. Pt continues to benefit from PT services to progress toward functional mobility goals, anticipate pt safe to DC home with assist from family (from a functional mobility standpoint) once medically cleared.     If plan is discharge home, recommend the following: A little help with walking and/or transfers;A lot of help with bathing/dressing/bathroom;Assist for transportation;Help with stairs or ramp for entrance;Assistance with cooking/housework   Can travel by private vehicle        Equipment Recommendations  None recommended by PT    Recommendations for Other Services       Precautions / Restrictions Precautions Precautions: Fall;Knee Precaution Booklet Issued: Yes (comment) Precaution Comments: elevated BP Required Braces or Orthoses: Knee Immobilizer - Right Knee  Immobilizer - Right: On at all times Knee Immobilizer - Left: On except when in CPM Restrictions Weight Bearing Restrictions Per Provider Order: Yes RLE Weight Bearing Per Provider Order: Weight bearing as tolerated     Mobility  Bed Mobility Overal bed mobility: Needs Assistance Bed Mobility: Supine to Sit, Sit to Supine     Supine to sit: Supervision Sit to supine: HOB elevated, Supervision   General bed mobility comments: Pt able to use gait belt as leg lifter/to slow RLE descent from flat bed without using rails; KI donned in supine prior to OOB mobility    Transfers Overall transfer level: Needs assistance Equipment used: Rolling walker (2 wheels) Transfers: Sit to/from Stand Sit to Stand: Supervision           General transfer comment: from bed>RW and back to slightly elevated bed.    Ambulation/Gait Ambulation/Gait assistance: Supervision Gait Distance (Feet): 150 Feet Assistive device: Rolling walker (2 wheels) Gait Pattern/deviations: Step-to pattern, Decreased dorsiflexion - right Gait velocity: appropriate given surgery, increased from previous date     General Gait Details: Improved upright posture and activity tolerance, HR to 117 bpm with exertion with increased cadence, mostly ~100-110 bpm during gait trial; SpO2 WFL on RA   Stairs Stairs: Yes Stairs assistance: Contact guard assist Stair Management: One rail Right, Step to pattern, Sideways Number of Stairs: 2 General stair comments: pt facing R rail and min cues for improved technique/for better pain control; pt also shown how to perform with RW on stairs if he prefers but he defers to practice after initial 2 steps up/down. no buckling or LOB, KI donned throughout.   Wheelchair Mobility  Tilt Bed    Modified Rankin (Stroke Patients Only)       Balance Overall balance assessment: Needs assistance Sitting-balance support: No upper extremity supported, Feet supported Sitting  balance-Leahy Scale: Good     Standing balance support: Bilateral upper extremity supported, Reliant on assistive device for balance, During functional activity Standing balance-Leahy Scale: Poor (Fair with single UE support/static standing) Standing balance comment: heavy reliance on RW due to pain                            Cognition Arousal: Alert Behavior During Therapy: WFL for tasks assessed/performed Overall Cognitive Status: Within Functional Limits for tasks assessed                                          Exercises Total Joint Exercises Ankle Circles/Pumps: AROM, Right, 10 reps, Supine Quad Sets: AROM, Right, 5 reps, Supine Short Arc Quad: AAROM, Right, 10 reps, Supine (AROM partial ROM, AA to achieve TKE) Hip ABduction/ADduction: AAROM, Right, Supine (a couple reps pt using gait belt to assist with bed mobility) Goniometric ROM: similar to previous date, PTA emphasizes importance of SAQ/QS to work on aggressive knee ROM and wearing KI snugly while not using CPM    General Comments        Pertinent Vitals/Pain Pain Assessment Pain Assessment: Faces Faces Pain Scale: Hurts little more Pain Location: R knee, most severe with RLE  knee ROM (>4/10 with ROM, mild to moderate with weight bearing) Pain Descriptors / Indicators: Aching, Discomfort, Grimacing Pain Intervention(s): Monitored during session, Premedicated before session, Repositioned, Ice applied    Home Living                          Prior Function            PT Goals (current goals can now be found in the care plan section) Acute Rehab PT Goals Patient Stated Goal: to return to independence PT Goal Formulation: With patient Time For Goal Achievement: 05/14/23 Progress towards PT goals: Progressing toward goals    Frequency    Min 1X/week      PT Plan      Co-evaluation              AM-PAC PT 6 Clicks Mobility   Outcome Measure  Help needed  turning from your back to your side while in a flat bed without using bedrails?: None Help needed moving from lying on your back to sitting on the side of a flat bed without using bedrails?: A Little Help needed moving to and from a bed to a chair (including a wheelchair)?: A Little Help needed standing up from a chair using your arms (e.g., wheelchair or bedside chair)?: A Little Help needed to walk in hospital room?: A Little Help needed climbing 3-5 steps with a railing? : A Little 6 Click Score: 19    End of Session Equipment Utilized During Treatment: Gait belt;Right knee immobilizer Activity Tolerance: Patient tolerated treatment well (does have elevated BP (likely due to pain/activity) as per chart review SBP was ~130 at rest in AM) Patient left: with call bell/phone within reach;with family/visitor present;in bed (daughter in room with him) Nurse Communication: Mobility status;Other (comment) (pt ready for DC) PT Visit Diagnosis: Other abnormalities of gait and mobility (R26.89);Muscle  weakness (generalized) (M62.81);Pain Pain - Right/Left: Right Pain - part of body: Knee     Time: 1007-1039 PT Time Calculation (min) (ACUTE ONLY): 32 min  Charges:    $Gait Training: 8-22 mins $Therapeutic Exercise: 8-22 mins PT General Charges $$ ACUTE PT VISIT: 1 Visit                     Sonya Gunnoe P., PTA Acute Rehabilitation Services Secure Chat Preferred 9a-5:30pm Office: [phone]    Connell HERO [facility] 05/12/2023, 10:50 AM

## 2023-05-13 ENCOUNTER — Telehealth: Payer: Self-pay

## 2023-05-13 NOTE — Telephone Encounter (Signed)
 Cheresse PT, with [facility]  would like verbal orders for HHPT for 1 week 1 and for 3 week 1.   Would like clarification on knee immobilizer?  Stated that patients pain is 5/10 and has less clicking. Patient had right THA on 05/10/2023.  CB# [phone].  Please advise.  Thank you.

## 2023-05-13 NOTE — Telephone Encounter (Signed)
 Ok thx - ki needed only for ambulation until can do 10 slr

## 2023-05-13 NOTE — Telephone Encounter (Signed)
 Normal for clicking.  It is a mechanical knee so it will not be seamless.  The stronger his leg gets though the more seamless it will be

## 2023-05-14 NOTE — Telephone Encounter (Signed)
IC advised. Patient verbalized understanding 

## 2023-05-17 ENCOUNTER — Telehealth: Payer: Self-pay

## 2023-05-17 NOTE — Telephone Encounter (Signed)
 Jim Motts, PT with [facility] wanted to let Dr. Rozelle Corning know that patient had a rash on his lower mid back, which may have been a reaction to the Methocarbamol .  Patient stopped taking Rx and took some benadryl  that reduced the symptoms. Per Leah BP has been elevated, 165/90 sometimes lower, but no BP issues before surgery.  Would like a call back at [phone].  Please advise.  Thank you.

## 2023-05-17 NOTE — Telephone Encounter (Signed)
 Dr. Rozelle Corning spoke with the patient about this.  He also called Leah.

## 2023-05-17 NOTE — Telephone Encounter (Signed)
 I called.

## 2023-05-23 DIAGNOSIS — M1711 Unilateral primary osteoarthritis, right knee: Secondary | ICD-10-CM

## 2023-05-24 ENCOUNTER — Ambulatory Visit (INDEPENDENT_AMBULATORY_CARE_PROVIDER_SITE_OTHER): Payer: Medicare Other | Admitting: Orthopedic Surgery

## 2023-05-24 ENCOUNTER — Encounter: Payer: Self-pay | Admitting: Orthopedic Surgery

## 2023-05-24 ENCOUNTER — Other Ambulatory Visit (INDEPENDENT_AMBULATORY_CARE_PROVIDER_SITE_OTHER): Payer: Self-pay

## 2023-05-24 DIAGNOSIS — Z96651 Presence of right artificial knee joint: Secondary | ICD-10-CM | POA: Diagnosis not present

## 2023-05-24 MED ORDER — TEMAZEPAM 15 MG PO CAPS: ORAL_CAPSULE | ORAL | 0 refills | Status: DC

## 2023-05-24 NOTE — Progress Notes (Signed)
Post-Op Visit Note   Patient: Pedro Castaneda           Date of Birth: 1956/01/14           MRN: [id number] Visit Date: 05/24/2023 PCP: Noberto Retort, MD   Assessment & Plan:  Chief Complaint:  Chief Complaint  Patient presents with   Right Knee - Routine Post Op    RIGHT TKA (surgery date 05-10-23)   Visit Diagnoses:  1. S/P total knee arthroplasty, right     Plan: Pedro Castaneda is a patient is now 2 weeks out right total knee replacement.  On examination incisions intact.  No calf tenderness.  Range of motion today is about 5-95 cold.  Gets up to 109 on CPM.  Blood pressure has been running high.  He checked it at home 172/94.  Radiographs look good.  Plan at this time is Restoril for sleep.  Refer to Dr. Tiburcio Pea to evaluate hypertension.  Start outpatient physical therapy agrees were physical therapy.  Follow-up in 4 weeks for clinical recheck.  Currently he is taking Tylenol only for pain.  He is off anti-inflammatories.  Follow-Up Instructions: No follow-ups on file.   Orders:  Orders Placed This Encounter  Procedures   XR Knee 1-2 Views Right   No orders of the defined types were placed in this encounter.   Imaging: XR Knee 1-2 Views Right Result Date: 05/24/2023 AP lateral radiographs right knee reviewed.  Press-fit prosthesis in good position alignment with no complicating features.   PMFS History: Patient Active Problem List   Diagnosis Date Noted   Arthritis of right knee 05/23/2023   S/P knee replacement 05/11/2023   OA (osteoarthritis) of knee 05/10/2023   S/P total knee replacement, right 05/10/2023   Acute appendicitis 03/24/2016   Chest pain, atypical 04/12/2015   Solitary pulmonary nodule 04/11/2015   Past Medical History:  Diagnosis Date   ED (erectile dysfunction)    HLD (hyperlipidemia)    diet Rx   Kidney stones 04/06/1994   kidney stones removal   Osteoarthritis of right knee    Squamous cell cancer of external ear, left    plan to do a  MOHS procedure in the near future per pt    Family History  Problem Relation Age of Onset   Breast cancer Mother    Atrial fibrillation Father    Heart attack Cousin     Past Surgical History:  Procedure Laterality Date   ELBOW SURGERY Right 1973   GANGLION CYST EXCISION  1982   Rt Foot   INGUINAL HERNIA REPAIR Right    KNEE SURGERY  2011   R knee arthroscopic   LAPAROSCOPIC APPENDECTOMY N/A 03/24/2016   Procedure: APPENDECTOMY LAPAROSCOPIC;  Surgeon: Luretha Murphy, MD;  Location: [facility];  Service: General;  Laterality: N/A;   LITHOTRIPSY     NEPHROSTOMY     TOTAL KNEE ARTHROPLASTY Right 05/10/2023   Procedure: RIGHT TOTAL KNEE ARTHROPLASTY;  Surgeon: Cammy Copa, MD;  Location: [facility] OR;  Service: Orthopedics;  Laterality: Right;   UMBILICAL HERNIA REPAIR     Social History   Occupational History   Occupation: Flooring   Occupation: Research officer, political party  Tobacco Use   Smoking status: Former    Current packs/day: 0.00    Average packs/day: 0.3 packs/day for 1 year (0.3 ttl pk-yrs)    Types: Cigarettes    Start date: 04/06/1974    Quit date: 04/07/1975    Years since quitting: 48.1  Smokeless tobacco: Never  Vaping Use   Vaping status: Never Used  Substance and Sexual Activity   Alcohol use: No    Alcohol/week: 0.0 standard drinks of alcohol   Drug use: No   Sexual activity: Not on file

## 2023-05-24 NOTE — Addendum Note (Signed)
Addended by: Rise Paganini on: 05/24/2023 02:00 PM   Modules accepted: Orders

## 2023-05-27 NOTE — Discharge Summary (Signed)
 Physician Discharge Summary      Patient ID: Pedro Castaneda MRN: [id number] DOB/AGE: [birth year] 68 y.o.  Admit date: 05/10/2023 Discharge date: 05/12/2023  Admission Diagnoses:  Principal Problem:   OA (osteoarthritis) of knee Active Problems:   S/P total knee replacement, right   S/P knee replacement   Arthritis of right knee   Discharge Diagnoses:  Same  Surgeries: Procedure(s): RIGHT TOTAL KNEE ARTHROPLASTY on 05/10/2023   Consultants:   Discharged Condition: Stable  Hospital Course: Pedro Castaneda is an 68 y.o. male who was admitted 05/10/2023 with a chief complaint of right knee pain, and found to have a diagnosis of right knee osteoarthritis.  They were brought to the operating room on 05/10/2023 and underwent the above named procedures.  Pt awoke from anesthesia without complication and was transferred to the floor. On POD1, patient was able to ambulate with physical therapy but pain was not very well-controlled.  By POD 2, pain control was much improved and patient continued to improve with mobility with physical therapy.  He had no red flag signs or symptoms throughout his stay.  He was discharged home on POD 2.  Pt will f/u with Dr. Addie in clinic in ~2 weeks.   Antibiotics given:  Anti-infectives (From admission, onward)    Start     Dose/Rate Route Frequency Ordered Stop   05/10/23 0837  vancomycin  (VANCOCIN ) powder  Status:  Discontinued          As needed 05/10/23 0838 05/10/23 1010   05/10/23 0600  ceFAZolin  (ANCEF ) IVPB 2g/100 mL premix        2 g 200 mL/hr over 30 Minutes Intravenous On call to O.R. 05/10/23 [id number] 05/10/23 0735     .  Recent vital signs:  Vitals:   05/12/23 0736 05/12/23 0900  BP: 131/73 (!) 188/86  Pulse: 71   Resp: 18   Temp: 98.3 F (36.8 C)   SpO2: 95%     Recent laboratory studies:  Results for orders placed or performed during the hospital encounter of 05/04/23  CBC   Collection Time: 05/04/23  9:27 AM  Result Value Ref Range    WBC 4.4 4.0 - 10.5 K/uL   RBC 4.90 4.22 - 5.81 MIL/uL   Hemoglobin 14.8 13.0 - 17.0 g/dL   HCT 54.8 60.9 - 47.9 %   MCV 92.0 80.0 - 100.0 fL   MCH 30.2 26.0 - 34.0 pg   MCHC 32.8 30.0 - 36.0 g/dL   RDW 87.3 88.4 - 84.4 %   Platelets 235 150 - 400 K/uL   nRBC 0.0 0.0 - 0.2 %  Basic metabolic panel   Collection Time: 05/04/23  9:27 AM  Result Value Ref Range   Sodium 139 135 - 145 mmol/L   Potassium 4.3 3.5 - 5.1 mmol/L   Chloride 108 98 - 111 mmol/L   CO2 26 22 - 32 mmol/L   Glucose, Bld 102 (H) 70 - 99 mg/dL   BUN 18 8 - 23 mg/dL   Creatinine, Ser 8.95 0.61 - 1.24 mg/dL   Calcium 9.4 8.9 - 89.6 mg/dL   GFR, Estimated >39 >39 mL/min   Anion gap 5 5 - 15  Urinalysis, w/ Reflex to Culture (Infection Suspected) -Urine, Clean Catch   Collection Time: 05/04/23  9:27 AM  Result Value Ref Range   Specimen Source URINE, CLEAN CATCH    Color, Urine YELLOW YELLOW   APPearance CLEAR CLEAR   Specific Gravity, Urine 1.020 1.005 - 1.030  pH 6.0 5.0 - 8.0   Glucose, UA NEGATIVE NEGATIVE mg/dL   Hgb urine dipstick NEGATIVE NEGATIVE   Bilirubin Urine NEGATIVE NEGATIVE   Ketones, ur NEGATIVE NEGATIVE mg/dL   Protein, ur NEGATIVE NEGATIVE mg/dL   Nitrite NEGATIVE NEGATIVE   Leukocytes,Ua NEGATIVE NEGATIVE   RBC / HPF 0-5 0 - 5 RBC/hpf   WBC, UA 0-5 0 - 5 WBC/hpf   Bacteria, UA NONE SEEN NONE SEEN   Squamous Epithelial / HPF 0-5 0 - 5 /HPF   Mucus PRESENT    Amorphous Crystal PRESENT   Surgical pcr screen   Collection Time: 05/04/23  9:28 AM   Specimen: Nasal Mucosa; Nasal Swab  Result Value Ref Range   MRSA, PCR NEGATIVE NEGATIVE   Staphylococcus aureus NEGATIVE NEGATIVE    Discharge Medications:   Allergies as of 05/12/2023   No Known Allergies      Medication List     TAKE these medications    aspirin  81 MG chewable tablet Chew 1 tablet (81 mg total) by mouth 2 (two) times daily.   CoQ-10 100 MG Caps Take 100 mg by mouth daily.   docusate sodium  100 MG  capsule Commonly known as: COLACE Take 1 capsule (100 mg total) by mouth 2 (two) times daily.   gabapentin  300 MG capsule Commonly known as: Neurontin  Take 1 capsule (300 mg total) by mouth 3 (three) times daily.   methocarbamol  500 MG tablet Commonly known as: ROBAXIN  Take 1 tablet (500 mg total) by mouth every 8 (eight) hours as needed for muscle spasms.   naproxen  375 MG tablet Commonly known as: NAPROSYN  Take 1 tablet (375 mg total) by mouth 2 (two) times daily with a meal.   oxyCODONE  5 MG immediate release tablet Commonly known as: Roxicodone  Take 1-2 tablets (5-10 mg total) by mouth every 4 (four) hours as needed for severe pain (pain score 7-10).   Red Yeast Rice 600 MG Caps Take 1,200 mg by mouth daily.   Saw Palmetto 450 MG Caps Take 450 mg by mouth daily.   tadalafil 5 MG tablet Commonly known as: CIALIS Take 5 mg by mouth daily as needed for erectile dysfunction.   vitamin C 100 MG tablet Take 100 mg by mouth daily.   Vitamin D  50 MCG (2000 UT) tablet Take 2,000 Units by mouth daily.   Zinc 50 MG Tabs Take 50 mg by mouth daily.        Diagnostic Studies: XR Knee 1-2 Views Right Result Date: 05/24/2023 AP lateral radiographs right knee reviewed.  Press-fit prosthesis in good position alignment with no complicating features.   Disposition: Discharge disposition: 01-Home or Self Care       Discharge Instructions     Call MD / Call 911   Complete by: As directed    If you experience chest pain or shortness of breath, CALL 911 and be transported to the hospital emergency room.  If you develope a fever above 101 F, pus (white drainage) or increased drainage or redness at the wound, or calf pain, call your surgeon's office.   Constipation Prevention   Complete by: As directed    Drink plenty of fluids.  Prune juice may be helpful.  You may use a stool softener, such as Colace (over the counter) 100 mg twice a day.  Use MiraLax (over the counter) for  constipation as needed.   Diet - low sodium heart healthy   Complete by: As directed    Discharge instructions  Complete by: As directed    You may shower, dressing is waterproof.  Do not remove the dressing, we will remove it at your first post-op appointment.  Do not take a bath or soak the knee in a tub or pool.  You may weightbear as you can tolerate on the operative leg with a walker.  Continue using the CPM machine 3 times per day for one hour each time, increasing the degrees of range of motion daily.  Use the blue cradle boot under your heel to work on getting your leg straight.  Do NOT put a pillow under your knee.  You will follow-up with Dr. Addie in the clinic in 2 weeks at your given appointment date.    INSTRUCTIONS AFTER JOINT REPLACEMENT   Remove items at home which could result in a fall. This includes throw rugs or furniture in walking pathways ICE to the affected joint every three hours while awake for 30 minutes at a time, for at least the first 3-5 days, and then as needed for pain and swelling.  Continue to use ice for pain and swelling. You may notice swelling that will progress down to the foot and ankle.  This is normal after surgery.  Elevate your leg when you are not up walking on it.   Continue to use the breathing machine you got in the hospital (incentive spirometer) which will help keep your temperature down.  It is common for your temperature to cycle up and down following surgery, especially at night when you are not up moving around and exerting yourself.  The breathing machine keeps your lungs expanded and your temperature down.   DIET:  As you were doing prior to hospitalization, we recommend a well-balanced diet.  DRESSING / WOUND CARE / SHOWERING  Keep the surgical dressing until follow up.  The dressing is water  proof, so you can shower without any extra covering.  IF THE DRESSING FALLS OFF or the wound gets wet inside, change the dressing with sterile gauze.   Please use good hand washing techniques before changing the dressing.  Do not use any lotions or creams on the incision until instructed by your surgeon.    ACTIVITY  Increase activity slowly as tolerated, but follow the weight bearing instructions below.   No driving for 6 weeks or until further direction given by your physician.  You cannot drive while taking narcotics.  No lifting or carrying greater than 10 lbs. until further directed by your surgeon. Avoid periods of inactivity such as sitting longer than an hour when not asleep. This helps prevent blood clots.  You may return to work once you are authorized by your doctor.     WEIGHT BEARING   Weight bearing as tolerated with assist device (walker, cane, etc) as directed, use it as long as suggested by your surgeon or therapist, typically at least 4-6 weeks.   EXERCISES  Results after joint replacement surgery are often greatly improved when you follow the exercise, range of motion and muscle strengthening exercises prescribed by your doctor. Safety measures are also important to protect the joint from further injury. Any time any of these exercises cause you to have increased pain or swelling, decrease what you are doing until you are comfortable again and then slowly increase them. If you have problems or questions, call your caregiver or physical therapist for advice.   Rehabilitation is important following a joint replacement. After just a few days of immobilization, the muscles of  the leg can become weakened and shrink (atrophy).  These exercises are designed to build up the tone and strength of the thigh and leg muscles and to improve motion. Often times heat used for twenty to thirty minutes before working out will loosen up your tissues and help with improving the range of motion but do not use heat for the first two weeks following surgery (sometimes heat can increase post-operative swelling).   These exercises can be done on a  training (exercise) mat, on the floor, on a table or on a bed. Use whatever works the best and is most comfortable for you.    Use music or television while you are exercising so that the exercises are a pleasant break in your day. This will make your life better with the exercises acting as a break in your routine that you can look forward to.   Perform all exercises about fifteen times, three times per day or as directed.  You should exercise both the operative leg and the other leg as well.  Exercises include:   Quad Sets - Tighten up the muscle on the front of the thigh (Quad) and hold for 5-10 seconds.   Straight Leg Raises - With your knee straight (if you were given a brace, keep it on), lift the leg to 60 degrees, hold for 3 seconds, and slowly lower the leg.  Perform this exercise against resistance later as your leg gets stronger.  Leg Slides: Lying on your back, slowly slide your foot toward your buttocks, bending your knee up off the floor (only go as far as is comfortable). Then slowly slide your foot back down until your leg is flat on the floor again.  Angel Wings: Lying on your back spread your legs to the side as far apart as you can without causing discomfort.  Hamstring Strength:  Lying on your back, push your heel against the floor with your leg straight by tightening up the muscles of your buttocks.  Repeat, but this time bend your knee to a comfortable angle, and push your heel against the floor.  You may put a pillow under the heel to make it more comfortable if necessary.   A rehabilitation program following joint replacement surgery can speed recovery and prevent re-injury in the future due to weakened muscles. Contact your doctor or a physical therapist for more information on knee rehabilitation.    CONSTIPATION  Constipation is defined medically as fewer than three stools per week and severe constipation as less than one stool per week.  Even if you have a regular bowel  pattern at home, your normal regimen is likely to be disrupted due to multiple reasons following surgery.  Combination of anesthesia, postoperative narcotics, change in appetite and fluid intake all can affect your bowels.   YOU MUST use at least one of the following options; they are listed in order of increasing strength to get the job done.  They are all available over the counter, and you may need to use some, POSSIBLY even all of these options:    Drink plenty of fluids (prune juice may be helpful) and high fiber foods Colace 100 mg by mouth twice a day  Senokot for constipation as directed and as needed Dulcolax (bisacodyl), take with full glass of water   Miralax (polyethylene glycol) once or twice a day as needed.  If you have tried all these things and are unable to have a bowel movement in the first 3-4 days  after surgery call either your surgeon or your primary doctor.    If you experience loose stools or diarrhea, hold the medications until you stool forms back up.  If your symptoms do not get better within 1 week or if they get worse, check with your doctor.  If you experience the worst abdominal pain ever or develop nausea or vomiting, please contact the office immediately for further recommendations for treatment.   ITCHING:  If you experience itching with your medications, try taking only a single pain pill, or even half a pain pill at a time.  You can also use Benadryl  over the counter for itching or also to help with sleep.   TED HOSE STOCKINGS:  Use stockings on both legs until for at least 2 weeks or as directed by physician office. They may be removed at night for sleeping.  MEDICATIONS:  See your medication summary on the After Visit Summary that nursing will review with you.  You may have some home medications which will be placed on hold until you complete the course of blood thinner medication.  It is important for you to complete the blood thinner medication as  prescribed.  PRECAUTIONS:  If you experience chest pain or shortness of breath - call 911 immediately for transfer to the hospital emergency department.   If you develop a fever greater that 101 F, purulent drainage from wound, increased redness or drainage from wound, foul odor from the wound/dressing, or calf pain - CONTACT YOUR SURGEON.                                                   FOLLOW-UP APPOINTMENTS:  If you do not already have a post-op appointment, please call the office for an appointment to be seen by your surgeon.  Guidelines for how soon to be seen are listed in your After Visit Summary, but are typically between 1-4 weeks after surgery.  OTHER INSTRUCTIONS:   Knee Replacement:  Do not place pillow under knee, focus on keeping the knee straight while resting. CPM instructions: 0-90 degrees, 2 hours in the morning, 2 hours in the afternoon, and 2 hours in the evening. Place foam block, curve side up under heel at all times except when in CPM or when walking.  DO NOT modify, tear, cut, or change the foam block in any way.  POST-OPERATIVE OPIOID TAPER INSTRUCTIONS: It is important to wean off of your opioid medication as soon as possible. If you do not need pain medication after your surgery it is ok to stop day one. Opioids include: Codeine, Hydrocodone (Norco, Vicodin), Oxycodone (Percocet, oxycontin ) and hydromorphone  amongst others.  Long term and even short term use of opiods can cause: Increased pain response Dependence Constipation Depression Respiratory depression And more.  Withdrawal symptoms can include Flu like symptoms Nausea, vomiting And more Techniques to manage these symptoms Hydrate well Eat regular healthy meals Stay active Use relaxation techniques(deep breathing, meditating, yoga) Do Not substitute Alcohol to help with tapering If you have been on opioids for less than two weeks and do not have pain than it is ok to stop all together.  Plan to  wean off of opioids This plan should start within one week post op of your joint replacement. Maintain the same interval or time between taking each dose and first decrease the dose.  Cut the total daily intake of opioids by one tablet each day Next start to increase the time between doses. The last dose that should be eliminated is the evening dose.   MAKE SURE YOU:  Understand these instructions.  Get help right away if you are not doing well or get worse.    Thank you for letting us  be a part of your medical care team.  It is a privilege we respect greatly.  We hope these instructions will help you stay on track for a fast and full recovery!    Dental Antibiotics:  In most cases prophylactic antibiotics for Dental procdeures after total joint surgery are not necessary.  Exceptions are as follows:  1. History of prior total joint infection  2. Severely immunocompromised (Organ Transplant, cancer chemotherapy, Rheumatoid biologic meds such as Humera)  3. Poorly controlled diabetes (A1C &gt; 8.0, blood glucose over 200)  If you have one of these conditions, contact your surgeon for an antibiotic prescription, prior to your dental procedure.   Increase activity slowly as tolerated   Complete by: As directed    Post-operative opioid taper instructions:   Complete by: As directed    POST-OPERATIVE OPIOID TAPER INSTRUCTIONS: It is important to wean off of your opioid medication as soon as possible. If you do not need pain medication after your surgery it is ok to stop day one. Opioids include: Codeine, Hydrocodone (Norco, Vicodin), Oxycodone (Percocet, oxycontin ) and hydromorphone  amongst others.  Long term and even short term use of opiods can cause: Increased pain response Dependence Constipation Depression Respiratory depression And more.  Withdrawal symptoms can include Flu like symptoms Nausea, vomiting And more Techniques to manage these symptoms Hydrate well Eat  regular healthy meals Stay active Use relaxation techniques(deep breathing, meditating, yoga) Do Not substitute Alcohol to help with tapering If you have been on opioids for less than two weeks and do not have pain than it is ok to stop all together.  Plan to wean off of opioids This plan should start within one week post op of your joint replacement. Maintain the same interval or time between taking each dose and first decrease the dose.  Cut the total daily intake of opioids by one tablet each day Next start to increase the time between doses. The last dose that should be eliminated is the evening dose.           Follow-up Information     Arloa Elsie SAUNDERS, MD Follow up.   Specialty: Family Medicine Contact information: [phone] [place] [address] [phone]         [facility] Follow up.   Why: home health services will be provided by [facility], start of care within 48 hours post discharge Contact information: [address] [place] [address] [phone]                  Signed: Herlene Calix 05/27/2023, 11:04 AM

## 2023-06-18 ENCOUNTER — Telehealth: Payer: Self-pay

## 2023-06-18 ENCOUNTER — Other Ambulatory Visit: Payer: Self-pay | Admitting: Orthopedic Surgery

## 2023-06-18 MED ORDER — TEMAZEPAM 15 MG PO CAPS: ORAL_CAPSULE | ORAL | 0 refills | Status: DC

## 2023-06-18 NOTE — Telephone Encounter (Signed)
 sent

## 2023-06-18 NOTE — Telephone Encounter (Signed)
 Patient asking for rxrf on sleep aid

## 2023-06-21 ENCOUNTER — Ambulatory Visit (INDEPENDENT_AMBULATORY_CARE_PROVIDER_SITE_OTHER): Payer: Medicare Other | Admitting: Orthopedic Surgery

## 2023-06-21 ENCOUNTER — Encounter: Payer: Self-pay | Admitting: Orthopedic Surgery

## 2023-06-21 DIAGNOSIS — Z96651 Presence of right artificial knee joint: Secondary | ICD-10-CM

## 2023-06-21 MED ORDER — TRAMADOL HCL 50 MG PO TABS: 50.0000 mg | ORAL_TABLET | Freq: Three times a day (TID) | ORAL | 0 refills | Status: DC | PRN

## 2023-06-21 NOTE — Progress Notes (Unsigned)
 Post-Op Visit Note   Patient: Pedro Castaneda           Date of Birth: 04-Mar-1956           MRN: [id number] Visit Date: 06/21/2023 PCP: Pedro Retort, MD   Assessment & Plan:  Chief Complaint:  Chief Complaint  Patient presents with   Right Knee - Routine Post Op    RIGHT TKA (surgery date 05-10-23)   Visit Diagnoses: No diagnosis found.  Plan: Pedro Castaneda is a 68 year old patient who underwent right total knee replacement 05/10/2023.  Not resting well but he is making progress in therapy.  States the pain is getting better.  Does take Exer strength Tylenol and Advil for symptoms along with tramadol at night.  He would like to get about 15-20 more degrees of flexion.  On exam he has range of motion of 0-1 05.  Plan at this time is to continue to work on flexion.  Tramadol refilled.  No calf tenderness negative Homans today.  Follow-Up Instructions: Return in about 6 weeks (around 08/02/2023).   Orders:  No orders of the defined types were placed in this encounter.  Meds ordered this encounter  Medications   traMADol (ULTRAM) 50 MG tablet    Sig: Take 1 tablet (50 mg total) by mouth every 8 (eight) hours as needed.    Dispense:  30 tablet    Refill:  0    Imaging: No results found.  PMFS History: Patient Active Problem List   Diagnosis Date Noted   Arthritis of right knee 05/23/2023   S/P knee replacement 05/11/2023   OA (osteoarthritis) of knee 05/10/2023   S/P total knee replacement, right 05/10/2023   Acute appendicitis 03/24/2016   Chest pain, atypical 04/12/2015   Solitary pulmonary nodule 04/11/2015   Past Medical History:  Diagnosis Date   ED (erectile dysfunction)    HLD (hyperlipidemia)    diet Rx   Kidney stones 04/06/1994   kidney stones removal   Osteoarthritis of right knee    Squamous cell cancer of external ear, left    plan to do a MOHS procedure in the near future per pt    Family History  Problem Relation Age of Onset   Breast cancer Mother     Atrial fibrillation Father    Heart attack Cousin     Past Surgical History:  Procedure Laterality Date   ELBOW SURGERY Right 1973   GANGLION CYST EXCISION  1982   Rt Foot   INGUINAL HERNIA REPAIR Right    KNEE SURGERY  2011   R knee arthroscopic   LAPAROSCOPIC APPENDECTOMY N/A 03/24/2016   Procedure: APPENDECTOMY LAPAROSCOPIC;  Surgeon: Pedro Murphy, MD;  Location: [facility];  Service: General;  Laterality: N/A;   LITHOTRIPSY     NEPHROSTOMY     TOTAL KNEE ARTHROPLASTY Right 05/10/2023   Procedure: RIGHT TOTAL KNEE ARTHROPLASTY;  Surgeon: Pedro Copa, MD;  Location: [facility] OR;  Service: Orthopedics;  Laterality: Right;   UMBILICAL HERNIA REPAIR     Social History   Occupational History   Occupation: Flooring   Occupation: Research officer, political party  Tobacco Use   Smoking status: Former    Current packs/day: 0.00    Average packs/day: 0.3 packs/day for 1 year (0.3 ttl pk-yrs)    Types: Cigarettes    Start date: 04/06/1974    Quit date: 04/07/1975    Years since quitting: 48.2   Smokeless tobacco: Never  Vaping Use   Vaping status:  Never Used  Substance and Sexual Activity   Alcohol use: No    Alcohol/week: 0.0 standard drinks of alcohol   Drug use: No   Sexual activity: Not on file

## 2023-08-02 ENCOUNTER — Ambulatory Visit (INDEPENDENT_AMBULATORY_CARE_PROVIDER_SITE_OTHER): Admitting: Orthopedic Surgery

## 2023-08-02 DIAGNOSIS — Z96651 Presence of right artificial knee joint: Secondary | ICD-10-CM

## 2023-08-03 ENCOUNTER — Encounter: Payer: Self-pay | Admitting: Orthopedic Surgery

## 2023-08-03 NOTE — Progress Notes (Signed)
 Post-Op Visit Note   Patient: Pedro Castaneda           Date of Birth: [birth year]           MRN: [id number] Visit Date: 08/02/2023 PCP: Roselind Congo, MD   Assessment & Plan:  Chief Complaint:  Chief Complaint  Patient presents with   Right Knee - Routine Post Op    05/10/23 right TKA   Visit Diagnoses:  1. S/P total knee arthroplasty, right     Plan:  Pedro Castaneda underwent right total knee replacement 05/10/2023.  Doing well overall.  Sleep improving.  Physical therapy finished last Friday.  Tramadol  has helped him with sleep.  Lunesta helped some.  He is able to walk about 1 and half miles.  Did have a little flareup of low back pain.  On examination has range of motion of 0-1 15.  Patella mobility is excellent.  Collaterals are stable.  Quad strength improving.  Plan at this time is to continue to work on gaining that last little bit of flexion.  I think he could get about maybe 5 more degrees.  Otherwise continue to work on strengthening including stationary bike work.  He will follow-up with us  as needed.  Follow-Up Instructions: No follow-ups on file.   Orders:  No orders of the defined types were placed in this encounter.  No orders of the defined types were placed in this encounter.   Imaging: No results found.  PMFS History: Patient Active Problem List   Diagnosis Date Noted   Arthritis of right knee 05/23/2023   S/P knee replacement 05/11/2023   OA (osteoarthritis) of knee 05/10/2023   S/P total knee replacement, right 05/10/2023   Acute appendicitis 03/24/2016   Chest pain, atypical 04/12/2015   Solitary pulmonary nodule 04/11/2015   Past Medical History:  Diagnosis Date   ED (erectile dysfunction)    HLD (hyperlipidemia)    diet Rx   Kidney stones 04/06/1994   kidney stones removal   Osteoarthritis of right knee    Squamous cell cancer of external ear, left    plan to do a MOHS procedure in the near future per pt    Family History  Problem Relation  Age of Onset   Breast cancer Mother    Atrial fibrillation Father    Heart attack Cousin     Past Surgical History:  Procedure Laterality Date   ELBOW SURGERY Right 1973   GANGLION CYST EXCISION  1982   Rt Foot   INGUINAL HERNIA REPAIR Right    KNEE SURGERY  2011   R knee arthroscopic   LAPAROSCOPIC APPENDECTOMY N/A 03/24/2016   Procedure: APPENDECTOMY LAPAROSCOPIC;  Surgeon: Jacolyn Matar, MD;  Location: [facility];  Service: General;  Laterality: N/A;   LITHOTRIPSY     NEPHROSTOMY     TOTAL KNEE ARTHROPLASTY Right 05/10/2023   Procedure: RIGHT TOTAL KNEE ARTHROPLASTY;  Surgeon: Jasmine Mesi, MD;  Location: [facility];  Service: Orthopedics;  Laterality: Right;   UMBILICAL HERNIA REPAIR     Social History   Occupational History   Occupation: Flooring   Occupation: Research officer, political party  Tobacco Use   Smoking status: Former    Current packs/day: 0.00    Average packs/day: 0.3 packs/day for 1 year (0.3 ttl pk-yrs)    Types: Cigarettes    Start date: 04/06/1974    Quit date: 04/07/1975    Years since quitting: 48.3   Smokeless tobacco: Never  Vaping Use  Vaping status: Never Used  Substance and Sexual Activity   Alcohol use: No    Alcohol/week: 0.0 standard drinks of alcohol   Drug use: No   Sexual activity: Not on file

## 2023-09-23 ENCOUNTER — Telehealth: Payer: Self-pay | Admitting: Internal Medicine

## 2023-09-23 NOTE — Telephone Encounter (Signed)
     I did not need his encounter

## 2024-01-01 ENCOUNTER — Encounter (HOSPITAL_COMMUNITY): Payer: Self-pay

## 2024-01-01 ENCOUNTER — Emergency Department (HOSPITAL_COMMUNITY)

## 2024-01-01 ENCOUNTER — Emergency Department (HOSPITAL_COMMUNITY)
Admission: EM | Admit: 2024-01-01 | Discharge: 2024-01-01 | Disposition: A | Discharge status: Home / Self Care | Attending: Emergency Medicine | Admitting: Emergency Medicine

## 2024-01-01 ENCOUNTER — Other Ambulatory Visit: Payer: Self-pay

## 2024-01-01 DIAGNOSIS — R109 Unspecified abdominal pain: Secondary | ICD-10-CM | POA: Diagnosis present

## 2024-01-01 DIAGNOSIS — Z7982 Long term (current) use of aspirin: Secondary | ICD-10-CM | POA: Insufficient documentation

## 2024-01-01 DIAGNOSIS — N390 Urinary tract infection, site not specified: Secondary | ICD-10-CM | POA: Insufficient documentation

## 2024-01-01 LAB — BASIC METABOLIC PANEL WITH GFR
Anion gap: 13 (ref 5–15)
BUN: 20 mg/dL (ref 8–23)
CO2: 20 mmol/L — ABNORMAL LOW (ref 22–32)
Calcium: 9.6 mg/dL (ref 8.9–10.3)
Chloride: 106 mmol/L (ref 98–111)
Creatinine, Ser: 1.08 mg/dL (ref 0.61–1.24)
GFR, Estimated: >60 mL/min (ref >60)
Glucose, Bld: 110 mg/dL — ABNORMAL HIGH (ref 70–99)
Potassium: 3.9 mmol/L (ref 3.5–5.1)
Sodium: 138 mmol/L (ref 135–145)

## 2024-01-01 LAB — CBC
HCT: 45.3 % (ref 39.0–52.0)
Hemoglobin: 14.6 g/dL (ref 13.0–17.0)
MCH: 29.3 pg (ref 26.0–34.0)
MCHC: 32.2 g/dL (ref 30.0–36.0)
MCV: 91 fL (ref 80.0–100.0)
Platelets: 233 K/uL (ref 150–400)
RBC: 4.98 MIL/uL (ref 4.22–5.81)
RDW: 13.1 % (ref 11.5–15.5)
WBC: 4.2 K/uL (ref 4.0–10.5)
nRBC: 0 % (ref 0.0–0.2)

## 2024-01-01 LAB — HEPATIC FUNCTION PANEL
ALT: 13 U/L (ref 0–44)
AST: 24 U/L (ref 15–41)
Albumin: 4.2 g/dL (ref 3.5–5.0)
Alkaline Phosphatase: 95 U/L (ref 38–126)
Bilirubin, Direct: 0.3 mg/dL — ABNORMAL HIGH (ref 0.0–0.2)
Indirect Bilirubin: 0.6 mg/dL (ref 0.3–0.9)
Total Bilirubin: 0.9 mg/dL (ref 0.0–1.2)
Total Protein: 6.7 g/dL (ref 6.5–8.1)

## 2024-01-01 LAB — URINALYSIS, ROUTINE W REFLEX MICROSCOPIC
Bilirubin Urine: NEGATIVE
Glucose, UA: NEGATIVE mg/dL
Ketones, ur: 20 mg/dL — AB
Leukocytes,Ua: NEGATIVE
Nitrite: NEGATIVE
Protein, ur: 100 mg/dL — AB
RBC / HPF: >50 RBC/hpf (ref 0–5)
Specific Gravity, Urine: 1.027 (ref 1.005–1.030)
WBC, UA: >50 WBC/hpf (ref 0–5)
pH: 5 (ref 5.0–8.0)

## 2024-01-01 MED ORDER — MORPHINE SULFATE (PF) 4 MG/ML IV SOLN
4.0000 mg | Freq: Once | INTRAVENOUS | Status: AC
  Administered 2024-01-01: 4 mg via INTRAVENOUS
  Filled 2024-01-01: qty 1

## 2024-01-01 MED ORDER — SODIUM CHLORIDE 0.9 % IV BOLUS
1000.0000 mL | Freq: Once | INTRAVENOUS | Status: AC
  Administered 2024-01-01: 1000 mL via INTRAVENOUS

## 2024-01-01 MED ORDER — CEPHALEXIN 500 MG PO CAPS: 500.0000 mg | ORAL_CAPSULE | Freq: Four times a day (QID) | ORAL | 0 refills | Status: DC

## 2024-01-01 MED ORDER — HYDROCODONE-ACETAMINOPHEN 5-325 MG PO TABS: 1.0000 | ORAL_TABLET | Freq: Four times a day (QID) | ORAL | 0 refills | Status: DC | PRN

## 2024-01-01 MED ORDER — ONDANSETRON HCL 4 MG/2ML IJ SOLN
4.0000 mg | Freq: Once | INTRAMUSCULAR | Status: AC
  Administered 2024-01-01: 4 mg via INTRAVENOUS
  Filled 2024-01-01: qty 2

## 2024-01-01 MED ORDER — CEPHALEXIN 500 MG PO CAPS
500.0000 mg | ORAL_CAPSULE | Freq: Once | ORAL | Status: AC
  Administered 2024-01-01: 500 mg via ORAL
  Filled 2024-01-01: qty 1

## 2024-01-01 NOTE — ED Triage Notes (Signed)
 Patient has right sided flank pain for 2 weeks. The last 3 days urine has been darker in color. Unable to sit still due to pain during triage. Nauseous. History of kidney stones.

## 2024-01-01 NOTE — ED Provider Notes (Signed)
 [place] EMERGENCY DEPARTMENT AT [facility] Provider Note   CSN: [id number] Arrival date & time: 01/01/24  1019     Patient presents with: Flank Pain   Pedro Castaneda is a 68 y.o. male.   68 year old male with prior medical history as detailed below presents for evaluation.  Patient reports to me acute onset right sided flank pain.  He reports the pain became significant around 1 AM.  He reports that he has had intermittent twinges of pain like this over the last 2 to 3 weeks.  They resolved.  The described pain was worse this morning.  Therefore he came to the ED.  He did not take anything for his pain prior to arriving in.  He denies fever.  He denies vomiting.  He reports mild nausea.  He reports that his urine looks a little darker.  He appears to be in no acute distress during evaluation.  The history is provided by the patient and medical records.       Prior to Admission medications   Medication Sig Start Date End Date Taking? Authorizing Provider  Ascorbic Acid (VITAMIN C) 100 MG tablet Take 100 mg by mouth daily.    [provider]  aspirin  81 MG chewable tablet Chew 1 tablet (81 mg total) by mouth 2 (two) times daily. 05/12/23   Magnant, Charles L, PA-C  Cholecalciferol  (VITAMIN D ) 50 MCG (2000 UT) tablet Take 2,000 Units by mouth daily.    [provider]  Coenzyme Q10 (COQ-10) 100 MG CAPS Take 100 mg by mouth daily.    [provider]  docusate sodium  (COLACE) 100 MG capsule Take 1 capsule (100 mg total) by mouth 2 (two) times daily. 05/12/23   Magnant, Carlin LITTIE, PA-C  gabapentin  (NEURONTIN ) 300 MG capsule Take 1 capsule (300 mg total) by mouth 3 (three) times daily. 05/12/23 05/11/24  Magnant, Charles L, PA-C  methocarbamol  (ROBAXIN ) 500 MG tablet Take 1 tablet (500 mg total) by mouth every 8 (eight) hours as needed for muscle spasms. 05/12/23   Magnant, Carlin LITTIE, PA-C  naproxen  (NAPROSYN ) 375 MG tablet Take 1 tablet (375 mg total) by  mouth 2 (two) times daily with a meal. 05/12/23   Magnant, Carlin LITTIE, PA-C  oxyCODONE  (ROXICODONE ) 5 MG immediate release tablet Take 1-2 tablets (5-10 mg total) by mouth every 4 (four) hours as needed for severe pain (pain score 7-10). 05/12/23   Magnant, Charles L, PA-C  Red Yeast Rice 600 MG CAPS Take 1,200 mg by mouth daily.    [provider]  Saw Palmetto 450 MG CAPS Take 450 mg by mouth daily.    [provider]  tadalafil (CIALIS) 5 MG tablet Take 5 mg by mouth daily as needed for erectile dysfunction.    [provider]  temazepam  (RESTORIL ) 15 MG capsule 1 po q hs prn 06/18/23   [name], MD  traMADol  (ULTRAM ) 50 MG tablet Take 1 tablet (50 mg total) by mouth every 8 (eight) hours as needed. 06/21/23   [name], MD  Zinc 50 MG TABS Take 50 mg by mouth daily.    [provider]    Allergies: Patient has no known allergies.    Review of Systems  All other systems reviewed and are negative.   Updated Vital Signs BP 135/87 (BP Location: Right Arm)   Pulse 70   Temp 97.9 F (36.6 C) (Oral)   Resp 18   Ht 5' 9 (1.753  m)   Wt 85.3 kg   SpO2 99%   BMI 27.76 kg/m   Physical Exam Vitals and nursing note reviewed.  Constitutional:      General: He is not in acute distress.    Appearance: Normal appearance. He is well-developed.  HENT:     Head: Normocephalic and atraumatic.  Eyes:     Conjunctiva/sclera: Conjunctivae normal.     Pupils: Pupils are equal, round, and reactive to light.  Cardiovascular:     Rate and Rhythm: Normal rate and regular rhythm.     Heart sounds: Normal heart sounds.  Pulmonary:     Effort: Pulmonary effort is normal. No respiratory distress.     Breath sounds: Normal breath sounds.  Abdominal:     General: There is no distension.     Palpations: Abdomen is soft.     Tenderness: There is no abdominal tenderness.  Musculoskeletal:        General: No deformity. Normal range of motion.      Cervical back: Normal range of motion and neck supple.  Skin:    General: Skin is warm and dry.  Neurological:     General: No focal deficit present.     Mental Status: He is alert and oriented to person, place, and time.     (all labs ordered are listed, but only abnormal results are displayed) Labs Reviewed  CBC  URINALYSIS, ROUTINE W REFLEX MICROSCOPIC  BASIC METABOLIC PANEL WITH GFR  HEPATIC FUNCTION PANEL    EKG: None  Radiology: No results found.   Procedures   Medications Ordered in the ED  morphine  (PF) 4 MG/ML injection 4 mg (has no administration in time range)  ondansetron  (ZOFRAN ) injection 4 mg (has no administration in time range)  sodium chloride  0.9 % bolus 1,000 mL (has no administration in time range)                                    Medical Decision Making Patient presents with increased suprapubic pain and discomfort.  He denies fever.  Obtained workup suggest likely UTI and cystitis as source of pain.  Antibiotics initiated here in the ED.  Patient is known to [facility].  He plans on following up with [facility].  He also has a PCP appointment on Wednesday.  He plans on following up with his PCP on Wednesday.  Incidentally, there is a cystic structure seen on the patient's liver.  This was discussed extensively with the patient.  He understands need for close follow-up with PCP and arrangement of outpatient MRI imaging.  Importance of close follow-up was stressed.  Strict return precautions given and understood.  Amount and/or Complexity of Data Reviewed Labs: ordered. Radiology: ordered.  Risk Prescription drug management.        Final diagnoses:  Urinary tract infection with hematuria, site unspecified    ED Discharge Orders     None          Laurice Maude BROCKS, MD 01/01/24 1400

## 2024-01-01 NOTE — Discharge Instructions (Addendum)
 Return for any problem.  As discussed, your symptoms are most suggestive of urine infection.  Take antibiotics as prescribed until completion.  Please follow-up with both your PCP and also with [facility].  As discussed, there is a cystic structure in your liver seen on today's CT.  This requires further workup as an outpatient.  Radiology recommends MRI imaging of your liver to further delineate exactly what this structure is.  Your PCP can help arrange this outpatient MRI.

## 2024-01-06 ENCOUNTER — Other Ambulatory Visit (HOSPITAL_COMMUNITY): Payer: Self-pay | Admitting: Family Medicine

## 2024-01-06 ENCOUNTER — Other Ambulatory Visit: Payer: Self-pay | Admitting: Family Medicine

## 2024-01-06 DIAGNOSIS — Z136 Encounter for screening for cardiovascular disorders: Secondary | ICD-10-CM

## 2024-01-06 DIAGNOSIS — K769 Liver disease, unspecified: Secondary | ICD-10-CM

## 2024-01-11 ENCOUNTER — Ambulatory Visit (HOSPITAL_COMMUNITY)
Admission: RE | Admit: 2024-01-11 | Discharge: 2024-01-11 | Disposition: A | Discharge status: Home / Self Care | Payer: Self-pay | Source: Ambulatory Visit | Attending: Family Medicine | Admitting: Family Medicine

## 2024-01-11 DIAGNOSIS — Z136 Encounter for screening for cardiovascular disorders: Secondary | ICD-10-CM | POA: Insufficient documentation

## 2024-01-21 ENCOUNTER — Ambulatory Visit
Admission: RE | Admit: 2024-01-21 | Discharge: 2024-01-21 | Disposition: A | Discharge status: Home / Self Care | Source: Ambulatory Visit | Attending: Family Medicine | Admitting: Family Medicine

## 2024-01-21 DIAGNOSIS — K769 Liver disease, unspecified: Secondary | ICD-10-CM

## 2024-01-23 ENCOUNTER — Inpatient Hospital Stay: Admission: RE | Admit: 2024-01-23 | Discharge: 2024-01-23 | Attending: Family Medicine | Admitting: Family Medicine

## 2024-01-23 MED ORDER — GADOPICLENOL 0.5 MMOL/ML IV SOLN
8.0000 mL | Freq: Once | INTRAVENOUS | Status: AC | PRN
  Administered 2024-01-23: 8 mL via INTRAVENOUS

## 2024-02-07 ENCOUNTER — Encounter: Payer: Self-pay | Admitting: Radiology

## 2024-03-07 ENCOUNTER — Other Ambulatory Visit: Payer: Self-pay | Admitting: *Deleted

## 2024-03-07 ENCOUNTER — Encounter: Payer: Self-pay | Admitting: Cardiovascular Disease

## 2024-03-07 ENCOUNTER — Ambulatory Visit: Attending: Cardiovascular Disease | Admitting: Cardiovascular Disease

## 2024-03-07 VITALS — BP 120/86 | HR 58 | Ht 69.0 in | Wt 203.4 lb

## 2024-03-07 DIAGNOSIS — E7800 Pure hypercholesterolemia, unspecified: Secondary | ICD-10-CM | POA: Insufficient documentation

## 2024-03-07 DIAGNOSIS — I2510 Atherosclerotic heart disease of native coronary artery without angina pectoris: Secondary | ICD-10-CM

## 2024-03-07 MED ORDER — ROSUVASTATIN CALCIUM 10 MG PO TABS: 10.0000 mg | ORAL_TABLET | Freq: Every day | ORAL | 3 refills | Status: AC

## 2024-03-07 NOTE — Progress Notes (Signed)
 Chief Complaint  Patient presents with   New Patient (Initial Visit)    CAD   History of Present Illness: 68 yo male with history of CAD and HLD who is here today as a new patient to re-establish in our office. He had been followed in our office by Dr. Claudene. Coronary CTA February 2020 with calcium score of 135 with mild non-obstructive disease noted in the LAD and Circumflex. Coronary calcium score of 543 when repeated in primary care in October 2025.   He denies any chest pain, dyspnea, palpitations, lower extremity edema, orthopnea, PND, dizziness, near syncope or syncope. He is very active. 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   He is friends with [name] Spare.   Primary Care Physician: Arloa Elsie SAUNDERS, MD   Past Medical History:  Diagnosis Date   CAD (coronary artery disease)    ED (erectile dysfunction)    HLD (hyperlipidemia)    diet Rx   Kidney stones 04/06/1994   kidney stones removal   Osteoarthritis of right knee    Squamous cell cancer of external ear, left    plan to do a MOHS procedure in the near future per pt    Past Surgical History:  Procedure Laterality Date   ELBOW SURGERY Right 1973   GANGLION CYST EXCISION  1982   Rt Foot   INGUINAL HERNIA REPAIR Right    KNEE SURGERY  2011   R knee arthroscopic   LAPAROSCOPIC APPENDECTOMY N/A 03/24/2016   Procedure: APPENDECTOMY LAPAROSCOPIC;  Surgeon: Donnice Lunger, MD;  Location: [facility];  Service: General;  Laterality: N/A;   LITHOTRIPSY     NEPHROSTOMY     TOTAL KNEE ARTHROPLASTY Right 05/10/2023   Procedure: RIGHT TOTAL KNEE ARTHROPLASTY;  Surgeon: [name], MD;  Location: [facility];  Service: Orthopedics;  Laterality: Right;   UMBILICAL HERNIA REPAIR      Current Outpatient Medications  Medication Sig Dispense Refill   Ascorbic Acid (VITAMIN C) 100 MG tablet Take 100 mg by mouth daily.     aspirin  81 MG chewable tablet Chew 1 tablet (81 mg total) by mouth 2 (two) times daily.  60 tablet 0   Cholecalciferol  (VITAMIN D ) 50 MCG (2000 UT) tablet Take 2,000 Units by mouth daily.     Coenzyme Q10 (COQ-10) 100 MG CAPS Take 100 mg by mouth daily.     cyanocobalamin (VITAMIN B12) 1000 MCG tablet Take 1,000 mcg by mouth daily.     gabapentin  (NEURONTIN ) 300 MG capsule Take 1 capsule (300 mg total) by mouth 3 (three) times daily. 30 capsule 0   Glucosamine-Chondroit-Vit C-Mn (GLUCOSAMINE 1500 COMPLEX PO) 1 tablet Orally twice a day     Multiple Vitamins-Minerals (MULTI FOR HIM) CAPS 1 capsule.     naproxen  (NAPROSYN ) 375 MG tablet Take 1 tablet (375 mg total) by mouth 2 (two) times daily with a meal. 60 tablet 0   naproxen  (NAPROSYN ) 375 MG tablet 1 tablet with a meal Orally every 12 hrs     rosuvastatin (CRESTOR) 10 MG tablet Take 1 tablet (10 mg total) by mouth daily. 90 tablet 3   Saw Palmetto 450 MG CAPS Take 450 mg by mouth daily.     tadalafil (CIALIS) 5 MG tablet Take 5 mg by mouth daily as needed for erectile dysfunction.     Turmeric 500 MG CAPS 1/2 capsule Orally once a day     Zinc 50 MG TABS Take 50 mg by mouth daily.  No current facility-administered medications for this visit.    No Known Allergies  Social History   Socioeconomic History   Marital status: Married    Spouse name: Not on file   Number of children: 3   Years of education: Not on file   Highest education level: Not on file  Occupational History   Occupation: Flooring   Occupation: Real Estate  Tobacco Use   Smoking status: Former    Current packs/day: 0.00    Average packs/day: 0.3 packs/day for 1 year (0.3 ttl pk-yrs)    Types: Cigarettes    Start date: 04/06/1974    Quit date: 04/07/1975    Years since quitting: 48.9   Smokeless tobacco: Never  Vaping Use   Vaping status: Never Used  Substance and Sexual Activity   Alcohol use: No    Alcohol/week: 0.0 standard drinks of alcohol   Drug use: No   Sexual activity: Not on file  Other Topics Concern   Not on file  Social History  Narrative   Not on file   Social Drivers of Health   Financial Resource Strain: Not on file  Food Insecurity: No Food Insecurity (05/10/2023)   Hunger Vital Sign    Worried About Running Out of Food in the Last Year: Never true    Ran Out of Food in the Last Year: Never true  Transportation Needs: No Transportation Needs (05/10/2023)   PRAPARE - Administrator, Civil Service (Medical): No    Lack of Transportation (Non-Medical): No  Physical Activity: Not on file  Stress: Not on file  Social Connections: Socially Integrated (05/10/2023)   Social Connection and Isolation Panel    Frequency of Communication with Friends and Family: More than three times a week    Frequency of Social Gatherings with Friends and Family: More than three times a week    Attends Religious Services: More than 4 times per year    Active Member of [employer] or Organizations: Yes    Attends Banker Meetings: 1 to 4 times per year    Marital Status: Married  Catering Manager Violence: Not At Risk (05/10/2023)   Humiliation, Afraid, Rape, and Kick questionnaire    Fear of Current or Ex-Partner: No    Emotionally Abused: No    Physically Abused: No    Sexually Abused: No    Family History  Problem Relation Age of Onset   Breast cancer Mother    Atrial fibrillation Father    Heart attack Cousin     Review of Systems:  As stated in the HPI and otherwise negative.   BP 120/86 (BP Location: Left Arm, Patient Position: Sitting, Cuff Size: Normal)   Pulse (!) 58   Ht 5' 9 (1.753 m)   Wt 203 lb 6.4 oz (92.3 kg)   SpO2 98%   BMI 30.04 kg/m   Physical Examination: General: Well developed, well nourished, NAD  HEENT: OP clear, mucus membranes moist  SKIN: warm, dry. No rashes. Neuro: No focal deficits  Musculoskeletal: Muscle strength 5/5 all ext  Psychiatric: Mood and affect normal  Neck: No JVD, no carotid bruits, no thyromegaly, no lymphadenopathy.  Lungs:Clear bilaterally, no  wheezes, rhonci, crackles Cardiovascular: Regular rate and rhythm. No murmurs, gallops or rubs. Abdomen:Soft. Bowel sounds present. Non-tender.  Extremities: No lower extremity edema. Pulses are 2 + in the bilateral DP/PT.  EKG:  EKG is ordered today. The ekg ordered today demonstrates  EKG Interpretation Date/Time:  Tuesday  March 07 2024 13:54:13 EST Ventricular Rate:  58 PR Interval:  162 QRS Duration:  92 QT Interval:  394 QTC Calculation: 386 R Axis:   -15  Text Interpretation: Sinus bradycardia Confirmed by Verlin Bruckner [id number]) on 03/07/2024 1:55:50 PM   Recent Labs: 01/01/2024: ALT 13; BUN 20; Creatinine, Ser 1.08; Hemoglobin 14.6; Platelets 233; Potassium 3.9; Sodium 138   Lipid Panel    Component Value Date/Time   CHOL 223 (H) 04/12/2018 0924   TRIG 70 04/12/2018 0924   HDL 46 04/12/2018 0924   CHOLHDL 4.8 04/12/2018 0924   CHOLHDL 4.8 05/29/2015 0806   VLDL 15 05/29/2015 0806   LDLCALC 163 (H) 04/12/2018 0924     Wt Readings from Last 3 Encounters:  03/07/24 203 lb 6.4 oz (92.3 kg)  01/01/24 188 lb (85.3 kg)  05/10/23 193 lb (87.5 kg)    Assessment and Plan:   1. CAD without angina: He has mild CAD noted by coronary CTA in 2020. His calcium score was repeated in primary care recently and his calcium burden in the coronaries has increased. He has no chest pain.  -Continue ASA -Start Crestor 10 mg daily.   2. Hyperlipidemia: LDL 120 in October 2025. He has never taken a statin. He is somewhat hesitant but he is willing to try. Will start Crestor 10 mg daily. Lipids and LFTs in 12 weeks.   Labs/ tests ordered today include:  Orders Placed This Encounter  Procedures   Lipid Profile   Hepatic function panel   EKG 12-Lead   Disposition:   F/U with me in one year  Signed, Bruckner Verlin, MD, [facility] 03/07/2024 2:33 PM    [facility] [address] [place], [place], [place]  [address] Phone: [phone]; Fax: [phone]

## 2024-03-07 NOTE — Patient Instructions (Signed)
 Medication Instructions:  Your physician has recommended you make the following change in your medication:  Stop red yeast rice Start Rosuvastatin 10 mg by mouth daily  *If you need a refill on your cardiac medications before your next appointment, please call your pharmacy*  Lab Work: Have fasting lab work done in about 12 weeks.  Lipid and liver profiles.  Can be done at the [facility] [address] on the first floor in our building or at any [facility] location If you have labs (blood work) drawn today and your tests are completely normal, you will receive your results only by: MyChart Message (if you have MyChart) OR A paper copy in the mail If you have any lab test that is abnormal or we need to change your treatment, we will call you to review the results.  Testing/Procedures: none  Follow-Up: At [facility], you and your health needs are our priority.  As part of our continuing mission to provide you with exceptional heart care, our providers are all part of one team.  This team includes your primary Cardiologist (physician) and Advanced Practice Providers or APPs (Physician Assistants and Nurse Practitioners) who all work together to provide you with the care you need, when you need it.  Your next appointment:   12 month(s)  Provider:   Lonni Cash, MD    We recommend signing up for the patient portal called MyChart.  Sign up information is provided on this After Visit Summary.  MyChart is used to connect with patients for Virtual Visits (Telemedicine).  Patients are able to view lab/test results, encounter notes, upcoming appointments, etc.  Non-urgent messages can be sent to your provider as well.   To learn more about what you can do with MyChart, go to [email or web address].   Other Instructions

## 2024-03-14 ENCOUNTER — Ambulatory Visit: Admitting: Cardiovascular Disease
